# Patient Record
Sex: Male | Born: 1985 | Hispanic: Yes | Marital: Married | Smoking: Former smoker
Health system: Southern US, Community
[De-identification: ages and names within clinical notes are randomized; demographics above are authoritative.]

## PROBLEM LIST (undated history)

## (undated) ENCOUNTER — Ambulatory Visit: Admission: EM | Discharge status: Absent without leave | Payer: BLUE CROSS/BLUE SHIELD

## (undated) DIAGNOSIS — J329 Chronic sinusitis, unspecified: Secondary | ICD-10-CM

## (undated) DIAGNOSIS — R519 Headache, unspecified: Secondary | ICD-10-CM

## (undated) DIAGNOSIS — I10 Essential (primary) hypertension: Secondary | ICD-10-CM

## (undated) DIAGNOSIS — F32A Depression, unspecified: Secondary | ICD-10-CM

## (undated) DIAGNOSIS — F329 Major depressive disorder, single episode, unspecified: Secondary | ICD-10-CM

## (undated) DIAGNOSIS — T8859XA Other complications of anesthesia, initial encounter: Secondary | ICD-10-CM

## (undated) HISTORY — PX: RHINOPLASTY: SUR1284

## (undated) HISTORY — DX: Major depressive disorder, single episode, unspecified: F32.9

## (undated) HISTORY — PX: HERNIA REPAIR: SHX51

## (undated) HISTORY — DX: Chronic sinusitis, unspecified: J32.9

## (undated) HISTORY — DX: Depression, unspecified: F32.A

---

## 2011-03-15 HISTORY — PX: NOSE SURGERY: SHX723

## 2011-04-16 ENCOUNTER — Ambulatory Visit (INDEPENDENT_AMBULATORY_CARE_PROVIDER_SITE_OTHER): Payer: BC Managed Care – PPO | Admitting: Family Medicine

## 2011-04-16 VITALS — BP 116/78 | HR 66 | Temp 98.3°F | Resp 12 | Ht 68.75 in | Wt 161.0 lb

## 2011-04-16 DIAGNOSIS — J329 Chronic sinusitis, unspecified: Secondary | ICD-10-CM

## 2011-04-16 DIAGNOSIS — J019 Acute sinusitis, unspecified: Secondary | ICD-10-CM

## 2011-04-16 DIAGNOSIS — Z0000 Encounter for general adult medical examination without abnormal findings: Secondary | ICD-10-CM | POA: Insufficient documentation

## 2011-04-16 DIAGNOSIS — J069 Acute upper respiratory infection, unspecified: Secondary | ICD-10-CM

## 2011-04-16 MED ORDER — HYDROCODONE-HOMATROPINE 5-1.5 MG/5ML PO SYRP: 5.0000 mL | ORAL_SOLUTION | Freq: Four times a day (QID) | ORAL | Status: AC | PRN

## 2011-04-16 MED ORDER — AMOXICILLIN 875 MG PO TABS: 875.0000 mg | ORAL_TABLET | Freq: Two times a day (BID) | ORAL | Status: AC

## 2011-04-16 MED ORDER — CEFTRIAXONE SODIUM 1 G IJ SOLR
1.0000 g | INTRAMUSCULAR | Status: DC
  Administered 2011-04-16: 1 g via INTRAMUSCULAR

## 2011-04-16 NOTE — Patient Instructions (Signed)
Sinusitis (Sinusitis) Los senos paranasales son bolsas de aire que se encuentran dentro de los huesos de la cara. La aparicin de bacterias en los senos paranasales lleva a una infeccin. Esta se denomina sinusitis.Estas infecciones generalmente son el resultado de una obstruccin en los orificios que drenan los senos.   SNTOMAS Generalmente, segn que seno se infecte, habr diferentes reas en las que puede aparecer dolor.    Los senos maxilares generalmente producen dolor detrs de los ojos.     La sinusitis frontal ocasiona dolor en el medio de la frente y sobre los ojos.  Entre otros problemas (sntomas) se incluyen:  Dolor en la zona posterior de los dientes superiores.     Una secrecin similar al pus (purulenta) proveniente de la nariz.     Toda inflamacin, calor o sensibilidad en estas mismas reas son indicios de infeccin.  TRATAMIENTO La sinusitis se diagnostica a travs del examen fsico y radiografas. Si le han tomado radiografas, asegrese de retirar los resultados. O consulte el modo en que podr obtenerlos. Su mdico le prescribir medicamentos (antibiticos). Estos medicamentos se indican para combatir la infeccin. Tambin le prescribir un descongestivo para reducir la inflamacin del seno paranasal.   INSTRUCCIONES PARA EL CUIDADO DOMICILIARIO  Utilice los medicamentos de venta libre o de prescripcin para el dolor, el malestar o la fiebre, segn se lo indique el profesional que lo asiste.     Beba gran cantidad de lquidos. Los lquidos ayudan a que las mucosas de los senos nasales drenen ms fcilmente.     Aplique bolsas de calor hmedo o hielo en las zonas ms doloridas para aliviar las molestias.     Utilice Sprays nasales salinos para ayudar a humedecer los senos nasales. Estos pueden encontrarse en la farmacia local.  SOLICITE ATENCIN MDICA DE INMEDIATO SI:  Tiene fiebre.     Dolor en aumento, dolor de cabeza intenso o dolor de dientes.     Nuseas,  vmitos o sudoracin.     Hinchazn infrecuente alrededor del rostro o problemas en la visin.  EST SEGURO QUE:    Comprende las instrucciones para el alta mdica.     Controlar su enfermedad.     Solicitar atencin mdica de inmediato segn las indicaciones.  Document Released: 12/08/2004 Document Revised: 11/10/2010 ExitCare Patient Information 2012 ExitCare, LLC. 

## 2011-04-16 NOTE — Progress Notes (Signed)
26 year old Hispanic man who comes in with 4 days of progressive sinus pain nasal congestion and cough. He feels that he has another sinus infection and in the past has done very well with a shot of antibiotics. Is not had any fever and his cough is intermittently productive. The cough does keep him awake at night. 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  Review of systems is entirely negative with the exception of the upper respiratory symptoms of cough sinus congestion and sinus pain.  Objective: Ears normal normocephalic, no acute distress, chest clear, heart regular without murmur, and nasal passages markedly swollen with purulent yellow discharge:.  Assessment: Acute sinusitis  Plan Rocephin amoxicillin and Hydromet

## 2011-05-02 ENCOUNTER — Other Ambulatory Visit: Payer: Self-pay | Admitting: Family Medicine

## 2011-05-07 ENCOUNTER — Ambulatory Visit (INDEPENDENT_AMBULATORY_CARE_PROVIDER_SITE_OTHER): Payer: BC Managed Care – PPO | Admitting: Family Medicine

## 2011-05-07 VITALS — BP 130/77 | HR 48 | Temp 98.0°F | Resp 16 | Ht 68.75 in | Wt 157.8 lb

## 2011-05-07 DIAGNOSIS — F419 Anxiety disorder, unspecified: Secondary | ICD-10-CM

## 2011-05-07 DIAGNOSIS — J029 Acute pharyngitis, unspecified: Secondary | ICD-10-CM

## 2011-05-07 DIAGNOSIS — R05 Cough: Secondary | ICD-10-CM

## 2011-05-07 DIAGNOSIS — F411 Generalized anxiety disorder: Secondary | ICD-10-CM

## 2011-05-07 DIAGNOSIS — J329 Chronic sinusitis, unspecified: Secondary | ICD-10-CM

## 2011-05-07 DIAGNOSIS — R059 Cough, unspecified: Secondary | ICD-10-CM

## 2011-05-07 NOTE — Progress Notes (Signed)
Patient returns feeling much better.  Still has sore throat.  Has some cough and a bit of shortness of breath on occasion. Also c/o nervousness on occasion and requests something for nerves.  O:  HEENT - red throat, ears clear, neck normal, nose red and congested. Patient friendly but anxious  A:  Generalized anxiety, improved URI  P: refill meds Alprazolam .25 bid prn #60 3 rf proAIR with 1 rf

## 2011-11-09 ENCOUNTER — Ambulatory Visit (INDEPENDENT_AMBULATORY_CARE_PROVIDER_SITE_OTHER): Payer: BC Managed Care – PPO | Admitting: Family Medicine

## 2011-11-09 ENCOUNTER — Ambulatory Visit: Payer: BC Managed Care – PPO

## 2011-11-09 VITALS — BP 134/80 | HR 60 | Temp 98.5°F | Resp 18 | Ht 68.5 in | Wt 154.0 lb

## 2011-11-09 DIAGNOSIS — R071 Chest pain on breathing: Secondary | ICD-10-CM

## 2011-11-09 DIAGNOSIS — Z0000 Encounter for general adult medical examination without abnormal findings: Secondary | ICD-10-CM

## 2011-11-09 LAB — COMPREHENSIVE METABOLIC PANEL
ALT: 37 U/L (ref 0–53)
AST: 26 U/L (ref 0–37)
Albumin: 5 g/dL (ref 3.5–5.2)
Calcium: 9.8 mg/dL (ref 8.4–10.5)
Chloride: 107 mEq/L (ref 96–112)
Potassium: 3.8 mEq/L (ref 3.5–5.3)
Total Protein: 7.4 g/dL (ref 6.0–8.3)

## 2011-11-09 LAB — LDL CHOLESTEROL, DIRECT: Direct LDL: 100 mg/dL — ABNORMAL HIGH

## 2011-11-09 LAB — COMPREHENSIVE METABOLIC PANEL WITH GFR
Alkaline Phosphatase: 71 U/L (ref 39–117)
BUN: 13 mg/dL (ref 6–23)
CO2: 28 meq/L (ref 19–32)
Creat: 0.89 mg/dL (ref 0.50–1.35)
Glucose, Bld: 95 mg/dL (ref 70–99)
Sodium: 141 meq/L (ref 135–145)
Total Bilirubin: 0.6 mg/dL (ref 0.3–1.2)

## 2011-11-09 LAB — POCT CBC
Granulocyte percent: 52.3 %G (ref 37–80)
HCT, POC: 45.7 % (ref 43.5–53.7)
Hemoglobin: 14.4 g/dL (ref 14.1–18.1)
Lymph, poc: 2.2 (ref 0.6–3.4)
MCH, POC: 27.9 pg (ref 27–31.2)
MCHC: 31.5 g/dL — AB (ref 31.8–35.4)
MCV: 88.6 fL (ref 80–97)
MID (cbc): 0.3 (ref 0–0.9)
MPV: 7.5 fL (ref 0–99.8)
POC Granulocyte: 2.8 (ref 2–6.9)
POC LYMPH PERCENT: 41.9 % (ref 10–50)
POC MID %: 5.8 %M (ref 0–12)
Platelet Count, POC: 300 10*3/uL (ref 142–424)
RBC: 5.16 M/uL (ref 4.69–6.13)
RDW, POC: 13.4 %
WBC: 5.3 10*3/uL (ref 4.6–10.2)

## 2011-11-09 NOTE — Progress Notes (Signed)
[facility]:  Office Visit  Chief Complaint:  Chief Complaint  Patient presents with  . Annual Exam    HPI: Theodore Bishop is a 26 y.o. male who complains of  CPE. He also has some CP without SOB.  He works as a Psychologist, occupational. Has some pleuritic CP with deep breaths. Denies fevers, chills, weightloss, cough, night sweats, hemoptysis.  History reviewed. PMH: + sinusitis Past Surgical History  Procedure Date  . Hernia repair    History   Social History  . Marital Status: Single    Spouse Name: N/A    Number of Children: N/A  . Years of Education: N/A   Social History Main Topics  . Smoking status: Former Smoker -- 0.0 packs/day for 9 years    Types: Cigarettes  . Smokeless tobacco: Never Used   Comment: smokes 1 cigarette per week  . Alcohol Use: No     quit x 1 month - previously drank 12pk every weekend  . Drug Use: No  . Sexually Active: Yes   Other Topics Concern  . None   Social History Narrative  . None   Family History  Problem Relation Age of Onset  . Cancer Mother    No Known Allergies Prior to Admission medications   Not on File     ROS: The patient denies fevers, chills, night sweats, unintentional weight loss,  palpitations, wheezing, dyspnea on exertion, nausea, vomiting, abdominal pain, dysuria, hematuria, melena, numbness, weakness, or tingling.   All other systems have been reviewed and were otherwise negative with the exception of those mentioned in the HPI and as above.    PHYSICAL EXAM: Filed Vitals:   11/09/11 1613  BP: 134/80  Pulse: 60  Temp: 98.5 F (36.9 C)  Resp: 18   Filed Vitals:   11/09/11 1613  Height: 5' 8.5" (1.74 m)  Weight: 154 lb (69.854 kg)   Body mass index is 23.08 kg/(m^2).  General: Alert, no acute distress HEENT:  Normocephalic, atraumatic, oropharynx patent. EOMI, PERRLA, fundoscopic exam grossly nl. Boggy red nasal mucosa, nose is crooked. Sinuses nontender Cardiovascular:  Regular rate  and rhythm, no rubs murmurs or gallops.  No Carotid bruits, radial pulse intact. No pedal edema.  Respiratory: Clear to auscultation bilaterally.  No wheezes, rales, or rhonchi.  No cyanosis, no use of accessory musculature GI: No organomegaly, abdomen is soft and non-tender, positive bowel sounds.  No masses. Skin: No rashes. Neurologic: Facial musculature symmetric. CN 2-12 grossly nl Psychiatric: Patient is appropriate throughout our interaction. Lymphatic: No cervical lymphadenopathy Musculoskeletal: Gait intact. GU: hernia repair intact. No lesions, masses, rashes, dc from testicles, scrotum   LABS: Results for orders placed in visit on 11/09/11  POCT CBC      Component Value Range   WBC 5.3  4.6 - 10.2 K/uL   Lymph, poc 2.2  0.6 - 3.4   POC LYMPH PERCENT 41.9  10 - 50 %L   MID (cbc) 0.3  0 - 0.9   POC MID % 5.8  0 - 12 %M   POC Granulocyte 2.8  2 - 6.9   Granulocyte percent 52.3  37 - 80 %G   RBC 5.16  4.69 - 6.13 M/uL   Hemoglobin 14.4  14.1 - 18.1 g/dL   HCT, POC 16.1  09.6 - 53.7 %   MCV 88.6  80 - 97 fL   MCH, POC 27.9  27 - 31.2 pg   MCHC 31.5 (*) 31.8 - 35.4  g/dL   RDW, POC 16.1     Platelet Count, POC 300  142 - 424 K/uL   MPV 7.5  0 - 99.8 fL     EKG/XRAY:   Primary read interpreted by Dr. Conley Rolls at [facility]. No acute cardiopulmonary process   ASSESSMENT/PLAN: Encounter Diagnoses  Name Primary?  . Annual physical exam Yes  . Chest pain on breathing    Patient doing well overall Labs pending At the last minute , patient asks for ENT referral. Patient desires ENT referral for recurrent sinusitis which he has been on multiple oral and nasal meds without relief. He has a crooked nose and is worried that a deviated septum might be the problem.      Hamilton Capri PHUONG, DO 11/09/2011 4:49 PM

## 2011-11-11 ENCOUNTER — Encounter: Payer: Self-pay | Admitting: Family Medicine

## 2011-11-16 ENCOUNTER — Encounter: Payer: Self-pay | Admitting: Family Medicine

## 2011-11-18 ENCOUNTER — Ambulatory Visit (INDEPENDENT_AMBULATORY_CARE_PROVIDER_SITE_OTHER): Payer: BC Managed Care – PPO | Admitting: Physician Assistant

## 2011-11-18 VITALS — BP 117/68 | HR 51 | Temp 98.6°F | Resp 16 | Ht 69.0 in | Wt 156.0 lb

## 2011-11-18 DIAGNOSIS — J029 Acute pharyngitis, unspecified: Secondary | ICD-10-CM

## 2011-11-18 DIAGNOSIS — J069 Acute upper respiratory infection, unspecified: Secondary | ICD-10-CM

## 2011-11-18 DIAGNOSIS — R059 Cough, unspecified: Secondary | ICD-10-CM

## 2011-11-18 DIAGNOSIS — R05 Cough: Secondary | ICD-10-CM

## 2011-11-18 MED ORDER — GUAIFENESIN ER 1200 MG PO TB12: 1.0000 | ORAL_TABLET | Freq: Two times a day (BID) | ORAL | Status: DC

## 2011-11-18 MED ORDER — HYDROCOD POLST-CHLORPHEN POLST 10-8 MG/5ML PO LQCR: 5.0000 mL | Freq: Every evening | ORAL | Status: DC | PRN

## 2011-11-18 MED ORDER — IPRATROPIUM BROMIDE 0.06 % NA SOLN: 2.0000 | Freq: Three times a day (TID) | NASAL | Status: DC

## 2011-11-18 MED ORDER — MAGIC MOUTHWASH W/LIDOCAINE: ORAL | Status: DC

## 2011-11-18 NOTE — Progress Notes (Signed)
  Subjective:    Patient ID: Theodore Bishop, male    DOB: 1985-07-09, 26 y.o.   MRN: [id number]  HPI  Pt presents to clinic with cold symptoms for the last several days with congestion and cough with ST that seems to be worse in the am.  He has had no exposures to strep that he knows of.  He has used no OTC meds.  Review of Systems  Constitutional: Negative for fever and chills.  HENT: Positive for congestion, sore throat, rhinorrhea and postnasal drip.   Respiratory: Positive for cough.   Neurological: Negative for headaches.       Objective:   Physical Exam  Vitals reviewed. Constitutional: He is oriented to person, place, and time. He appears well-developed and well-nourished.  HENT:  Head: Normocephalic and atraumatic.  Right Ear: Hearing, tympanic membrane, external ear and ear canal normal.  Left Ear: Hearing, tympanic membrane, external ear and ear canal normal.  Nose: Nose normal.  Mouth/Throat: Uvula is midline. Posterior oropharyngeal erythema (mild) present. No oropharyngeal exudate or posterior oropharyngeal edema.  Eyes: Conjunctivae are normal.  Cardiovascular: Normal rate, regular rhythm and normal heart sounds.   Pulmonary/Chest: Effort normal and breath sounds normal.  Neurological: He is alert and oriented to person, place, and time.  Skin: Skin is warm and dry.  Psychiatric: He has a normal mood and affect. His behavior is normal. Judgment and thought content normal.          Assessment & Plan:   1. Sore throat    2. URI (upper respiratory infection)  Guaifenesin (MUCINEX MAXIMUM STRENGTH) 1200 MG TB12, ipratropium (ATROVENT) 0.06 % nasal spray  3. Cough  chlorpheniramine-HYDROcodone (TUSSIONEX PENNKINETIC ER) 10-8 MG/5ML LQCR   Push fluids.  Tylenol/motrin prn.

## 2013-04-23 ENCOUNTER — Ambulatory Visit: Payer: BC Managed Care – PPO

## 2013-04-23 ENCOUNTER — Ambulatory Visit (INDEPENDENT_AMBULATORY_CARE_PROVIDER_SITE_OTHER): Payer: BC Managed Care – PPO | Admitting: Family Medicine

## 2013-04-23 VITALS — BP 108/66 | HR 72 | Temp 97.8°F | Resp 16 | Ht 68.0 in | Wt 160.6 lb

## 2013-04-23 DIAGNOSIS — R059 Cough, unspecified: Secondary | ICD-10-CM

## 2013-04-23 DIAGNOSIS — R05 Cough: Secondary | ICD-10-CM

## 2013-04-23 DIAGNOSIS — H11009 Unspecified pterygium of unspecified eye: Secondary | ICD-10-CM

## 2013-04-23 DIAGNOSIS — H11002 Unspecified pterygium of left eye: Secondary | ICD-10-CM

## 2013-04-23 DIAGNOSIS — R042 Hemoptysis: Secondary | ICD-10-CM

## 2013-04-23 LAB — POCT CBC
GRANULOCYTE PERCENT: 51.7 % (ref 37–80)
HEMATOCRIT: 42.2 % — AB (ref 43.5–53.7)
HEMOGLOBIN: 13.5 g/dL — AB (ref 14.1–18.1)
Lymph, poc: 2.1 (ref 0.6–3.4)
MCH, POC: 28.6 pg (ref 27–31.2)
MCHC: 32 g/dL (ref 31.8–35.4)
MCV: 89.5 fL (ref 80–97)
MID (cbc): 0.3 (ref 0–0.9)
MPV: 7.4 fL (ref 0–99.8)
POC Granulocyte: 2.6 (ref 2–6.9)
POC LYMPH PERCENT: 42.2 %L (ref 10–50)
POC MID %: 6.1 %M (ref 0–12)
Platelet Count, POC: 243 10*3/uL (ref 142–424)
RBC: 4.72 M/uL (ref 4.69–6.13)
RDW, POC: 12 %
WBC: 5 10*3/uL (ref 4.6–10.2)

## 2013-04-23 MED ORDER — AZITHROMYCIN 250 MG PO TABS: ORAL_TABLET | ORAL | Status: DC

## 2013-04-23 MED ORDER — HYDROCODONE-HOMATROPINE 5-1.5 MG/5ML PO SYRP: 5.0000 mL | ORAL_SOLUTION | ORAL | Status: DC | PRN

## 2013-04-23 NOTE — Progress Notes (Signed)
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 He got sick a week ago on Wednesday. After a few days he was feeling bad and [place] He was given an injection, he does not know if it was cortisone or antibiotics. He got better for about 2 days, then got feeling worse again. He is coughing a moderate amount. This morning he was coughing up blood which concerned him. He does not have any headache or ear pain. Last week he fell he had the flu. He is concerned about his left eye which has a growth on it. He has a minimal sore throat. At it's not a terrible sore throat, he just feels funny. He has not had chest pain. His phlegm is clear.   Objective: Pleasant gentleman in no acute distress. TMs are normal. Left has a moderately large pterygium on it. It extends 2 mm over the iris. His throat is clear. Neck supple without nodes or thyromegaly. Chest is clear to percussion and auscultation. Heart regular without murmurs. Abdomen soft, nontender  Assessment: Hemoptysis Cough Pterygium left eye  Plan: We'll refer him to an ophthalmologist to get the eye evaluated  Check a CBC and chest x-ray on him and then decide treatment.  Results for orders placed in visit on 04/23/13  POCT CBC      Result Value Range   WBC 5.0  4.6 - 10.2 K/uL   Lymph, poc 2.1  0.6 - 3.4   POC LYMPH PERCENT 42.2  10 - 50 %L   MID (cbc) 0.3  0 - 0.9   POC MID % 6.1  0 - 12 %M   POC Granulocyte 2.6  2 - 6.9   Granulocyte percent 51.7  37 - 80 %G   RBC 4.72  4.69 - 6.13 M/uL   Hemoglobin 13.5 (*) 14.1 - 18.1 g/dL   HCT, POC 09.842.2 (*) 11.943.5 - 53.7 %   MCV 89.5  80 - 97 fL   MCH, POC 28.6  27 - 31.2 pg   MCHC 32.0  31.8 - 35.4 g/dL   RDW, POC 14.712.0     Platelet Count, POC 243  142 - 424 K/uL   MPV 7.4  0 - 99.8 fL   [facility] reading (PRIMARY) by  Dr. Alwyn RenHopper Normal cxr  Bronchitis.  Cough syrup Azithromycin Continue the nose spray that you have Return if problems

## 2013-04-23 NOTE — Patient Instructions (Signed)
Drink plenty of fluids  Take antibiotics as directed  Return if not improving or if continued to cough up blood

## 2013-05-13 ENCOUNTER — Telehealth: Payer: Self-pay

## 2013-08-31 ENCOUNTER — Ambulatory Visit (INDEPENDENT_AMBULATORY_CARE_PROVIDER_SITE_OTHER): Payer: BC Managed Care – PPO | Admitting: Emergency Medicine

## 2013-08-31 VITALS — BP 112/60 | HR 80 | Temp 98.1°F | Resp 16 | Ht 69.5 in | Wt 161.0 lb

## 2013-08-31 DIAGNOSIS — H5710 Ocular pain, unspecified eye: Secondary | ICD-10-CM

## 2013-08-31 DIAGNOSIS — H5712 Ocular pain, left eye: Secondary | ICD-10-CM

## 2013-08-31 DIAGNOSIS — R51 Headache: Secondary | ICD-10-CM

## 2013-08-31 MED ORDER — OFLOXACIN 0.3 % OP SOLN: 1.0000 [drp] | Freq: Four times a day (QID) | OPHTHALMIC | Status: DC

## 2013-08-31 MED ORDER — MELOXICAM 7.5 MG PO TABS: ORAL_TABLET | ORAL | Status: DC

## 2013-08-31 NOTE — Patient Instructions (Signed)
Abrasin corneal (Corneal Abrasion) La crnea es la cubierta transparente en la parte anterior y central del ojo. Cuando mira la parte colorida del ojo (iris), est mirando a travs de la crnea. La crnea es un tejido muy delgado formado por varias capas. La capa superficial es una capa nica de clulas (epitelio corneal) y es uno de los tejidos ms sensibles del organismo. Si un rasguo o una lesin hacen que el epitelio corneal se desprenda, a esto se lo llama abrasin corneal. Si la lesin se extiende a los tejidos que se encuentran por debajo del epitelio, la afeccin se denomina lcera corneal. CAUSAS   Rasguos.  Traumatismos.  Cuerpo extrao en el ojo. Algunas personas tienen recurrencias de abrasiones en la zona de la lesin original incluso despus de que esta ha sanado (sndrome de erosin recurrente). El sndrome de erosin recurrente generalmente mejora y desaparece con el tiempo. SNTOMAS   Dolor en el ojo.  Dificultad o imposibilidad de Financial controller ojo lesionado.  El ojo est muy sensible a la luz.  Las erosiones recurrentes tienden a ocurrir de Wellsite geologist repentina, a primera hora de la maana, generalmente al despertar y abrir los ojos. DIAGNSTICO  Su mdico podr diagnosticar una abrasin corneal durante un examen ocular. Generalmente se coloca un tinte en el ojo, usando un gotero o una pequea tira de papel humedecida con sus lgrimas. Al examinar el ojo con una luz especial, aparece claramente la abrasin destacada por el tinte. TRATAMIENTO   Las abrasiones pequeas pueden tratarse con gotas o un ungento con antibitico.  Es posible que le apliquen un parche de presin sobre el ojo. Si este es [address], siga las instrucciones de su mdico respecto de cundo Corporate treasurer. No conduzca ni opere maquinaria mientras lleve puesto el parche. Es difcil juzgar las Sales promotion account executive. Si la abrasin se infecta y se disemina hacia tejidos ms profundos de  la crnea, puede producirse una lcera corneal. Esto es ms grave porque puede causar una cicatriz en la crnea. Las cicatrices en la crnea interfieren con el paso de la luz a travs de esta y causan prdida de la visin en el ojo involucrado. INSTRUCCIONES PARA EL CUIDADO EN EL HOGAR  Utilice los medicamentos o el ungento segn lo indicado. Utilice los medicamentos de venta libre o recetados para Primary school teacher, el malestar o la fiebre, segn se lo indique el mdico.  No conduzca ni opere maquinarias mientras tenga el parche en el ojo. En estas condiciones no puede juzgar correctamente las distancias.  Si el mdico le ha dado fecha para una visita de control, es importante que concurra. No cumplir con las visitas de control puede dar como resultado una infeccin grave en el ojo o una prdida permanente de la visin. Si hay algn problema que le impide acudir a la cita, avsele a su mdico. SOLICITE ATENCIN MDICA SI:   Electronics engineer, tiene sensibilidad a la luz y experimenta una sensacin de picazn en un ojo o en ambos.  El parche de presin se afloja continuamente y puede parpadear debajo del parche despus del tratamiento.  Aparece algn tipo de secrecin en el ojo despus del tratamiento o si despierta con los prpados pegados por la maana.  Por la [place], siente los mismos sntomas que sinti en los [address] en que tuvo la abrasin original, algunas semanas o meses despus de la curacin. ASEGRESE DE QUE:   Comprende estas instrucciones.  Controlar su afeccin.  Recibir [place] de  inmediato si no mejora o si empeora. Document Released: 02/28/2005 Document Revised: 03/05/2013 [facility] Patient Information 2015 [place], [place]. This information is not intended to replace advice given to you by your health care provider. Make sure you discuss any questions you have with your health care provider. Cefalea migraosa (Migraine Headache) Una cefalea migraosa es un dolor intenso y punzante  en uno o ambos lados de la cabeza. La migraa puede durar desde 30 minutos hasta varias horas. CAUSAS  No siempre se conoce la causa exacta de la cefalea migraosa. Sin embargo, IT consultantpueden aparecer Circuit Citycuando los nervios del cerebro se irritan y liberan ciertas sustancias qumicas que causan inflamacin. Esto ocasiona dolor. Existen tambin ciertos factores que pueden desencadenar las migraas, como los siguientes:  Alcohol.  Fumar.  Estrs.  La menstruacin  Quesos aejados.  Los alimentos o las bebidas que contienen nitratos, glutamato, aspartamo o tiramina.  Falta de sueo.  Chocolate.  Cafena.  Hambre.  Actividad fsica extenuante.  Fatiga.  Medicamentos que se usan para tratar Aeronautical engineerel dolor en el pecho (nitroglicerina), pldoras anticonceptivas, estrgeno y algunos medicamentos para la hipertensin arterial. SIGNOS Y SNTOMAS  Dolor en uno o ambos lados de la cabeza.  Dolor pulsante o punzante.  Dolor intenso que impide Ameren Corporationrealizar las actividades diarias.  Dolor que se agrava por cualquier actividad fsica.  Nuseas, vmitos o ambos.  Mareos.  Dolor con la exposicin a las luces brillantes, a los ruidos fuertes o la [place]actividad.  Sensibilidad general a las luces brillantes, a los ruidos fuertes o a los Limited Brandsolores. Antes de sufrir una migraa, puede recibir seales de advertencia (aura). Un aura puede incluir:  Ver las luces intermitentes.  Ver puntos brillantes, halos o lneas en zigzag.  Tener una visin en tnel o visin borrosa.  Sensacin de entumecimiento u hormigueo.  Dificultad para hablar  Debilidad muscular. DIAGNSTICO  La cefalea migraosa se diagnostica en funcin de lo siguiente:  Sntomas.  Examen fsico.  Neomia DearUna TC (tomografa computada) o resonancia magntica de la cabeza. Estas pruebas de diagnstico por imagen no pueden diagnosticar las migraas, pero pueden ayudar a Sales promotion account executivedescartar otras causas de las cefaleas. TRATAMIENTO Le prescribirn medicamentos  para Engineer, materialsaliviar el dolor y las nuseas. Tambin podrn administrarse medicamentos para ayudar a Armed forces training and education officerprevenir las migraas recurrentes.  INSTRUCCIONES PARA EL CUIDADO EN EL HOGAR  Slo tome medicamentos de venta libre o recetados para Primary school teachercalmar el dolor o Environmental health practitionerel malestar, segn las indicaciones de su mdico. No se recomienda usar los opiceos a Air cabin crewlargo plazo.  Cuando tenga la migraa, acustese en un cuarto oscuro y tranquilo  Lleve un registro diario para Financial risk analystaveriguar lo que puede provocar las Soil scientistcefaleas migraosas. Por ejemplo, escriba:  Lo que usted come y bebe.  Cunto tiempo duerme.  Algn cambio en su dieta o en los medicamentos.  Limite el consumo de bebidas alcohlicas.  Si fuma, deje de hacerlo.  Duerma entre 7 y 9horas, o segn las recomendaciones del mdico.  Limite el estrs.  Mantenga las luces tenues si le Goodrich Corporationmolestan las luces brillantes y la [place]migraa empeora. SOLICITE ATENCIN MDICA DE INMEDIATO SI:   La migraa se hace cada vez ms intensa.  Tiene fiebre.  Presenta rigidez en el cuello.  Tiene prdida de visin.  Presenta debilidad muscular o prdida del control muscular.  Comienza a perder el equilibrio o tiene problemas para caminar.  Sufre mareos o se desmaya.  Tiene sntomas graves que son diferentes a los primeros sntomas. ASEGRESE DE QUE:   Comprende estas instrucciones.  Controlar su afeccin.  Recibir ayuda de inmediato si no mejora o si empeora. Document Released: 02/28/2005 Document Revised: 12/19/2012 [facility] Patient Information 2015 [place], [place]. This information is not intended to replace advice given to you by your health care provider. Make sure you discuss any questions you have with your health care provider. Corneal Abrasion The cornea is the clear covering at the front and center of the eye. When looking at the colored portion of the eye (iris), you are looking through the cornea. This very thin tissue is made up of many layers. The surface layer  is a single layer of cells (corneal epithelium) and is one of the most sensitive tissues in the body. If a scratch or injury causes the corneal epithelium to come off, it is called a corneal abrasion. If the injury extends to the tissues below the epithelium, the condition is called a corneal ulcer. CAUSES   Scratches.  Trauma.  Foreign body in the eye. Some people have recurrences of abrasions in the area of the original injury even after it has healed (recurrent erosion syndrome). Recurrent erosion syndrome generally improves and goes away with time. SYMPTOMS   Eye pain.  Difficulty or inability to keep the injured eye open.  The eye becomes very sensitive to light.  Recurrent erosions tend to happen suddenly, first thing in the morning, usually after waking up and opening the eye. DIAGNOSIS  Your health care provider can diagnose a corneal abrasion during an eye exam. Dye is usually placed in the eye using a drop or a small paper strip moistened by your tears. When the eye is examined with a special light, the abrasion shows up clearly because of the dye. TREATMENT   Small abrasions may be treated with antibiotic drops or ointment alone.  A pressure patch may be put over the eye. If this is done, follow your doctor's instructions for when to remove the patch. Do not drive or use machines while the eye patch is on. Judging distances is hard to do with a patch on. If the abrasion becomes infected and spreads to the deeper tissues of the cornea, a corneal ulcer can result. This is serious because it can cause corneal scarring. Corneal scars interfere with light passing through the cornea and cause a loss of vision in the involved eye. HOME CARE INSTRUCTIONS  Use medicine or ointment as directed. Only take over-the-counter or prescription medicines for pain, discomfort, or fever as directed by your health care provider.  Do not drive or operate machinery if your eye is patched. Your  ability to judge distances is impaired.  If your health care provider has given you a follow-up appointment, it is very important to keep that appointment. Not keeping the appointment could result in a severe eye infection or permanent loss of vision. If there is any problem keeping the appointment, let your health care provider know. SEEK MEDICAL CARE IF:   You have pain, light sensitivity, and a scratchy feeling in one eye or both eyes.  Your pressure patch keeps loosening up, and you can blink your eye under the patch after treatment.  Any kind of discharge develops from the eye after treatment or if the lids stick together in the morning.  You have the same symptoms in the morning as you did with the original abrasion days, weeks, or months after the abrasion healed. MAKE SURE YOU:   Understand these instructions.  Will watch your condition.  Will get help right away if you are not  doing well or get worse. Document Released: 02/26/2000 Document Revised: 03/05/2013 Document Reviewed: 11/05/2012 [facility] GroupExitCare Patient Information 2015 [place]ExitCare, [place]LLC. This information is not intended to replace advice given to you by your health care provider. Make sure you discuss any questions you have with your health care provider.

## 2013-08-31 NOTE — Progress Notes (Signed)
Subjective:    Patient ID: Norman HerrlichHomero Oquendo, male    DOB: 05/06/1985, 28 y.o.   MRN: [id number]   This chart was scribed for Lesle ChrisSteven Inayah Woodin, MD by Chestine SporeSoijett Blue, ED Scribe. The patient was seen in room 5 at 1:17 PM.   Chief Complaint  Patient presents with  . Conjunctivitis    to the left eye x's 4 days  . Headache    x's 3 days    HPI  Therron Ardyth HarpsHernandez is a 28 y.o. male who presents today complaining of left eye pain onset 4 days ago. He states that his eye has been red for 4 days and the HA came after. 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 He states that he sometimes wears his protection, but he not all the time. He states that he has had a wielded injury pain to his eye before and it lasted for only 1 day. He states that the current pain seems similar. He states that his sisters kid had pink eye last week. He states that he has associated symptoms of HA and a runny nose only on the left side, with congestion to the left nose. He denies any other associated symptoms. 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 He states that he does not currently wear contacts. He states that he has never had headaches like this before. He states that he took Nyquil with no relief for his symptoms.   He states that he has been wearing sunglasses. He states that the sunglasses help a littler with the pain. He states that the eye drops given during the visit helped alleviate some of his pain. He denies vomiting, numbness, weakness, and appetite change.   Past Medical History  Diagnosis Date  . Sinusitis     Current Outpatient Prescriptions on File Prior to Visit  Medication Sig Dispense Refill  . Alum & Mag Hydroxide-Simeth (MAGIC MOUTHWASH W/LIDOCAINE) SOLN 1 tsp po q1-2h prn sore throat  120 mL  0  . azithromycin (ZITHROMAX) 250 MG tablet Take 2 pills today, then one daily for 4 days for infection  6 tablet  0  . chlorpheniramine-HYDROcodone (TUSSIONEX PENNKINETIC ER) 10-8 MG/5ML LQCR  Take 5 mLs by mouth at bedtime as needed.  50 mL  0  . Guaifenesin (MUCINEX MAXIMUM STRENGTH) 1200 MG TB12 Take 1 tablet (1,200 mg total) by mouth 2 (two) times daily.  14 each  0  . HYDROcodone-homatropine (HYCODAN) 5-1.5 MG/5ML syrup Take 5 mLs by mouth every 4 (four) hours as needed for cough.  120 mL  0  . ipratropium (ATROVENT) 0.06 % nasal spray Place 2 sprays into the nose 3 (three) times daily.  15 mL  0   No current facility-administered medications on file prior to visit.   No Known Allergies   Review of Systems  Constitutional: Negative for appetite change.  HENT: Positive for congestion and rhinorrhea.   Eyes: Positive for pain and redness. Negative for photophobia.  Gastrointestinal: Negative for vomiting and diarrhea.  Neurological: Positive for headaches. Negative for weakness.      BP 112/60  Pulse 80  Temp(Src) 98.1 F (36.7 C) (Oral)  Resp 16  Ht 5' 9.5" (1.765 m)  Wt 161 lb (73.029 kg)  BMI 23.44 kg/m2  SpO2 98%   Objective:   Physical Exam  Nursing note and vitals reviewed. Constitutional: He is oriented to person, place, and time. He appears well-developed and well-nourished. No distress.  HENT:  Head: Normocephalic and atraumatic.  Eyes: EOM are normal. Pupils are equal, round, and reactive to light. Left conjunctiva is injected.  Slit lamp exam:      The left eye shows fluorescein uptake (fine uptake on the lower portion of the cornea).  Dis-margins are sharp. Two drops of paperacane were dropped in his eye. Lid everted and swapped. fluoresence had no uptake.    Neck: Neck supple. No tracheal deviation present.  Cardiovascular: Normal rate.   Pulmonary/Chest: Effort normal. No respiratory distress.  Musculoskeletal: Normal range of motion.  Neurological: He is alert and oriented to person, place, and time.  Skin: Skin is warm and dry.  Psychiatric: He has a normal mood and affect. His behavior is normal.         Assessment & Plan:  DIAGNOSTIC  STUDIES: Oxygen Saturation is 98% on room air, normal by my interpretation.    COORDINATION OF CARE: 1:27 PM-Discussed treatment plan which includeswith pt at bedside and pt agreed to plan.  1:32 PM- Recheck with Pt.   I do suspect the patient has a low-grade corneal abrasion secondary to welding exposure. We'll treat with Ocuflox and Mobic for pain recheck if not improved in 48 hours. I personally performed the services described in this documentation, which was scribed in my presence. The recorded information has been reviewed and is accurate.

## 2014-05-10 ENCOUNTER — Ambulatory Visit (INDEPENDENT_AMBULATORY_CARE_PROVIDER_SITE_OTHER): Payer: BLUE CROSS/BLUE SHIELD | Admitting: Family Medicine

## 2014-05-10 ENCOUNTER — Ambulatory Visit (INDEPENDENT_AMBULATORY_CARE_PROVIDER_SITE_OTHER): Payer: BLUE CROSS/BLUE SHIELD

## 2014-05-10 VITALS — BP 98/64 | HR 73 | Temp 97.9°F | Resp 16 | Ht 68.5 in | Wt 157.2 lb

## 2014-05-10 DIAGNOSIS — M25511 Pain in right shoulder: Secondary | ICD-10-CM

## 2014-05-10 DIAGNOSIS — G8929 Other chronic pain: Secondary | ICD-10-CM

## 2014-05-10 DIAGNOSIS — M25512 Pain in left shoulder: Secondary | ICD-10-CM

## 2014-05-10 MED ORDER — DICLOFENAC SODIUM 75 MG PO TBEC: 75.0000 mg | DELAYED_RELEASE_TABLET | Freq: Two times a day (BID) | ORAL | Status: DC

## 2014-05-10 NOTE — Progress Notes (Signed)
Subjective: Patient is here with an 8 month history of shoulder pain in his left shoulder. Actually his right shoulder is hurting also but the left was worse. 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 He's not been boxing for the last few months because of the pain. When he flexes shoulder forward his back of the shoulder hurt and on top of the shoulder hurts. He has hard time raising his hands up above his head because that hurts. No real specific injury.  Objective: Good range of motion. Tender along the posterior aspect of the shoulder girdle on the left. Right does not seem to be tender. Good range of motion. Motor strength symmetrical and good.  Assessment: Bilateral shoulder strain, left worse than right  Plan: Patient wanted an injection of cortisone. I decided to try him on some diclofenac first. Advised him of the risks of injection. I think it is wiser to try some diclofenac first, but if it does not help he can return and we will put a shot of cortisone in there. There is always at risk of infection.  [facility] reading (PRIMARY) by  Dr. Alwyn RenHopper Normal shoulder.

## 2014-05-10 NOTE — Patient Instructions (Signed)
Take diclofenac one twice daily  If not improving in the next 2 weeks please return for a cortisone injection

## 2014-06-03 ENCOUNTER — Ambulatory Visit (INDEPENDENT_AMBULATORY_CARE_PROVIDER_SITE_OTHER): Payer: BLUE CROSS/BLUE SHIELD | Admitting: Physician Assistant

## 2014-06-03 VITALS — BP 124/74 | HR 68 | Temp 98.0°F | Resp 17 | Ht 66.0 in | Wt 156.0 lb

## 2014-06-03 DIAGNOSIS — J069 Acute upper respiratory infection, unspecified: Secondary | ICD-10-CM | POA: Diagnosis not present

## 2014-06-03 DIAGNOSIS — M542 Cervicalgia: Secondary | ICD-10-CM | POA: Diagnosis not present

## 2014-06-03 DIAGNOSIS — B9789 Other viral agents as the cause of diseases classified elsewhere: Principal | ICD-10-CM

## 2014-06-03 MED ORDER — MELOXICAM 15 MG PO TABS: 15.0000 mg | ORAL_TABLET | Freq: Every day | ORAL | Status: DC

## 2014-06-03 MED ORDER — HYDROCOD POLST-CHLORPHEN POLST 10-8 MG/5ML PO LQCR: 5.0000 mL | Freq: Two times a day (BID) | ORAL | Status: DC | PRN

## 2014-06-03 MED ORDER — IPRATROPIUM BROMIDE 0.03 % NA SOLN: 2.0000 | Freq: Two times a day (BID) | NASAL | Status: DC

## 2014-06-03 NOTE — Patient Instructions (Signed)
Take mobic once a day for neck pain - do not take with ibuprofen, advil, motrin or aleve.  You may continue to take tylenol. Use nasal spray twice a day. Use cough syrup at night to help you sleep. Call me in 3-4 days if your symptoms are not getting any better and I will prescribe you something stronger

## 2014-06-03 NOTE — Progress Notes (Signed)
Subjective:    Patient ID: Theodore HerrlichHomero Bishop, male    DOB: 06/28/1985, 29 y.o.   MRN: [id number]  HPI  This is a 29 year old male presenting with cough, nasal congestion, sinus pressure and sore throat x 6 days. Cough is minimally productive. He is having neck pain described as soreness. He denies any change in activity other than coughing. Denies fever, chills, otalgia, SOB or wheezing.  He is taking nyquil, dayquil and tylenol and helping some. Does not smoke. He does not have a history of asthma. He does not have a history of allergies. No sick contacts.   Review of Systems  Constitutional: Negative for fever and chills.  HENT: Positive for congestion, sinus pressure and sore throat. Negative for ear pain.   Eyes: Negative for redness.  Respiratory: Positive for cough. Negative for shortness of breath and wheezing.   Gastrointestinal: Negative for nausea, vomiting and abdominal pain.  Skin: Negative for rash.  Allergic/Immunologic: Negative for environmental allergies.  Hematological: Negative for adenopathy.  Psychiatric/Behavioral: Positive for sleep disturbance.    There are no active problems to display for this patient.  Prior to Admission medications   Medication Sig Start Date End Date Taking? Authorizing Provider  ibuprofen (ADVIL,MOTRIN) 200 MG tablet Take 200 mg by mouth every 6 (six) hours as needed.   Yes Historical Provider, MD   No Known Allergies  Patient's social and family history were reviewed.     Objective:   Physical Exam  Constitutional: He is oriented to person, place, and time. He appears well-developed and well-nourished. No distress.  HENT:  Head: Normocephalic and atraumatic.  Right Ear: Hearing, tympanic membrane, external ear and ear canal normal.  Left Ear: Hearing, tympanic membrane, external ear and ear canal normal.  Nose: Mucosal edema present. Right sinus exhibits no maxillary sinus tenderness and no frontal sinus tenderness. Left sinus  exhibits no maxillary sinus tenderness and no frontal sinus tenderness.  Mouth/Throat: Uvula is midline and mucous membranes are normal. Posterior oropharyngeal erythema present. No oropharyngeal exudate or posterior oropharyngeal edema.  Eyes: Conjunctivae and lids are normal. Right eye exhibits no discharge. Left eye exhibits no discharge. No scleral icterus.  Neck: Normal range of motion. Muscular tenderness (right paraspinal) present. No spinous process tenderness present. No Brudzinski's sign noted.  Cardiovascular: Normal rate, regular rhythm, normal heart sounds and normal pulses.   No murmur heard. Pulmonary/Chest: Effort normal and breath sounds normal. No respiratory distress. He has no wheezes. He has no rhonchi. He has no rales.  Musculoskeletal: Normal range of motion.  Lymphadenopathy:       Head (right side): No submental, no submandibular and no tonsillar adenopathy present.       Head (left side): No submental, no submandibular and no tonsillar adenopathy present.    He has no cervical adenopathy.  Neurological: He is alert and oriented to person, place, and time.  Skin: Skin is warm, dry and intact. No lesion and no rash noted.  Psychiatric: He has a normal mood and affect. His speech is normal and behavior is normal. Thought content normal.   BP 124/74 mmHg  Pulse 68  Temp(Src) 98 F (36.7 C) (Oral)  Resp 17  Ht 5\' 6"  (1.676 m)  Wt 156 lb (70.761 kg)  BMI 25.19 kg/m2  SpO2 97%     Assessment & Plan:  1. Neck pain Meloxicam for neck pain. - meloxicam (MOBIC) 15 MG tablet; Take 1 tablet (15 mg total) by mouth daily.  Dispense:  30 tablet; Refill: 1  2. Viral URI with cough Focus is on supportive care, see meds prescribed below. If not getting better in 3-4 days he will call and I will happy to send in an abx. - ipratropium (ATROVENT) 0.03 % nasal spray; Place 2 sprays into both nostrils 2 (two) times daily.  Dispense: 30 mL; Refill: 0 - chlorpheniramine-HYDROcodone  (TUSSIONEX PENNKINETIC ER) 10-8 MG/5ML LQCR; Take 5 mLs by mouth every 12 (twelve) hours as needed for cough (cough).  Dispense: 80 mL; Refill: 0   Nicole V. Dyke Brackett, MHS [facility]  06/04/2014

## 2014-06-04 ENCOUNTER — Telehealth: Payer: Self-pay

## 2014-06-04 MED ORDER — HYDROCOD POLST-CHLORPHEN POLST 10-8 MG/5ML PO LQCR: 5.0000 mL | Freq: Two times a day (BID) | ORAL | Status: DC | PRN

## 2014-06-04 NOTE — Telephone Encounter (Signed)
Called pt using [employer] to advise we have the paper Rx for cough med here to pick up if he needs it. He advised that it was already at the pharmacy. This one here must be a duplicate copy, and I asked Delaney Meigsamara to shred it.

## 2014-12-19 ENCOUNTER — Ambulatory Visit (INDEPENDENT_AMBULATORY_CARE_PROVIDER_SITE_OTHER): Payer: BLUE CROSS/BLUE SHIELD | Admitting: Family Medicine

## 2014-12-19 VITALS — BP 110/74 | HR 60 | Temp 98.3°F | Resp 18 | Ht 68.25 in | Wt 150.8 lb

## 2014-12-19 DIAGNOSIS — N529 Male erectile dysfunction, unspecified: Secondary | ICD-10-CM

## 2014-12-19 DIAGNOSIS — Z0000 Encounter for general adult medical examination without abnormal findings: Secondary | ICD-10-CM | POA: Diagnosis not present

## 2014-12-19 DIAGNOSIS — F411 Generalized anxiety disorder: Secondary | ICD-10-CM | POA: Diagnosis not present

## 2014-12-19 DIAGNOSIS — B360 Pityriasis versicolor: Secondary | ICD-10-CM | POA: Diagnosis not present

## 2014-12-19 NOTE — Patient Instructions (Signed)
You appear to be very healthy on your physical examination  Continue to get regular exercise  Make sure that you take time and have plenty of foreplay before sexual function. If you do not take enough time with your spell's to enjoy each other, is much more difficult to get a good erection. Learn to communicate with each other so that you do the things that each other enjoys when having sex.  I will let you know the results of your lipids (cholesterol and triglycerides) in a few days.  Make sure you always drink plenty of fluids. That will help to avoid having dizziness.  If the anxiety gets worse we can talk about medications. However if you are going to be boxing, I think we need to be very careful about giving any medicines that might slow your reflexes.  Return in one year or as needed

## 2014-12-19 NOTE — Progress Notes (Signed)
Patient ID: Theodore Bishop, male    DOB: 03/23/1985  Age: 29 y.o. MRN: [id number]  Chief Complaint  Patient presents with  . Annual Exam    Subjective:  History: 29 year old man who is here for his annual physical examination. He has no major problems. He had some little issues he wants to discuss. He is very fearful of anything that will bring extra charges to his bill. We talked about a lot of things. He has been married for about a year and a half, but his wife just got to the [place] from [place] 4 months ago. He has had some instances where he has had problems getting a good erection. He says his mind is busy thinking about other things that has distracted him. We had a long talk about the need for appropriate time and foreplay for his sexual activity. He gets anxious. 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 We talked about flight and fight responses, and that that was shortly probably a part of him feeling this way.  Past medical history: Surgery: Bilateral inguinal herniorrhaphy Medical illnesses: None Regular medications: None Allergies: None   Social history: As above he recently got married. He does box. He works as a Psychologist, occupational.  Family history: Both parents are living and well. No major familial diseases  Review of systems Constitutional: Unremarkable HEENT: Unremarkable Cardiovascular: Unremarkable Respiratory: Unremarkable GI: Unremarkable GU: Unremarkable Musculoskeletal: Unremarkable Dermatologic: Unremarkable. He does have a little variable skin coloration on his back that could be tinea versicolor. Psychiatric: Anxiety as noted above Neurologic: Unremarkable Endocrinologic: Unremarkable  Current allergies, medications, problem list, past/family and social histories reviewed.  Objective:  BP 110/74 mmHg  Pulse 60  Temp(Src) 98.3 F (36.8 C) (Oral)  Resp 18  Ht 5' 8.25" (1.734 m)  Wt 150  lb 12.8 oz (68.402 kg)  BMI 22.75 kg/m2  SpO2 99% Physical examination: Healthy young man in no acute distress. TMs normal. Eyes PERRLA. Fundi benign. He does have a pterygium on the left eye. Throat was clear. Teeth satisfactory. Neck supple without nodes or thyromegaly. Chest clear. Heart regular without murmurs. Abdomen soft without mass or tenderness. Normal male external genitalia, uncircumcised. Testes descended. No hernias. Extremities unremarkable. Skin has some verbal coloration on his back which could be tinea versicolor    Assessment & Plan:   Assessment: 1. Annual physical exam   2. Erectile dysfunction, unspecified erectile dysfunction type   3. Generalized anxiety disorder   4. Tinea versicolor       Plan:  Orders Placed This Encounter  Procedures  . Lipid panel      Patient Instructions  You appear to be very healthy on your physical examination  Continue to get regular exercise  Make sure that you take time and have plenty of foreplay before sexual function. If you do not take enough time with your spell's to enjoy each other, is much more difficult to get a good erection. Learn to communicate with each other so that you do the things that each other enjoys when having sex.  I will let you know the results of your lipids (cholesterol and triglycerides) in a few days.  Make sure you always drink plenty of fluids. That will help to avoid having dizziness.  If the anxiety gets worse we can talk about medications. However if you are going to be boxing, I think we need to be  very careful about giving any medicines that might slow your reflexes.  Return in one year or as needed     Return in about 1 year (around 12/19/2015).   Arshiya Jakes, MD 12/19/2014

## 2014-12-20 LAB — LIPID PANEL
CHOLESTEROL: 143 mg/dL (ref 125–200)
HDL: 32 mg/dL — ABNORMAL LOW (ref >=40)
LDL CALC: 93 mg/dL (ref <130)
Total CHOL/HDL Ratio: 4.5 Ratio (ref <=5.0)
Triglycerides: 91 mg/dL (ref <150)
VLDL: 18 mg/dL (ref <30)

## 2015-05-14 ENCOUNTER — Ambulatory Visit (INDEPENDENT_AMBULATORY_CARE_PROVIDER_SITE_OTHER): Payer: BLUE CROSS/BLUE SHIELD | Admitting: Physician Assistant

## 2015-05-14 VITALS — BP 128/80 | HR 81 | Temp 98.4°F | Resp 16 | Ht 68.0 in | Wt 158.4 lb

## 2015-05-14 DIAGNOSIS — J111 Influenza due to unidentified influenza virus with other respiratory manifestations: Secondary | ICD-10-CM | POA: Diagnosis not present

## 2015-05-14 DIAGNOSIS — H11002 Unspecified pterygium of left eye: Secondary | ICD-10-CM | POA: Diagnosis not present

## 2015-05-14 DIAGNOSIS — R69 Illness, unspecified: Principal | ICD-10-CM

## 2015-05-14 MED ORDER — HYDROCODONE-HOMATROPINE 5-1.5 MG/5ML PO SYRP: 2.5000 mL | ORAL_SOLUTION | Freq: Every evening | ORAL | Status: DC | PRN

## 2015-05-14 MED ORDER — NAPROXEN 375 MG PO TABS: 375.0000 mg | ORAL_TABLET | Freq: Two times a day (BID) | ORAL | Status: DC

## 2015-05-14 MED ORDER — CETIRIZINE-PSEUDOEPHEDRINE ER 5-120 MG PO TB12: 1.0000 | ORAL_TABLET | Freq: Two times a day (BID) | ORAL | Status: DC

## 2015-05-14 NOTE — Progress Notes (Signed)
   05/18/2015 2:22 PM   DOB: [birth year] / MRN: [id number]  SUBJECTIVE:  Theodore Bishop is a 30 y.o. male presenting for for the evaluation of headache that started 4 days ago. He associates HA and muscle aches.   He denies fever and difficulty breathing. Treatments tried thus far include several OTC preps with fair to poor relief. He reports sick contacts.  He has No Known Allergies.   He  has a past medical history of Sinusitis.    He  reports that he has quit smoking. His smoking use included Cigarettes. He smoked 0.00 packs per day for 9 years. He has never used smokeless tobacco. He reports that he does not drink alcohol or use illicit drugs. He  reports that he currently engages in sexual activity. The patient  has past surgical history that includes Hernia repair.  His family history includes Cancer in his mother.  Review of Systems  Constitutional: Negative for fever.  Respiratory: Negative for shortness of breath.   Cardiovascular: Negative for chest pain.  Musculoskeletal: Positive for myalgias.  Skin: Negative for rash.  Neurological: Negative for dizziness.    Problem list and medications reviewed and updated by myself where necessary, and exist elsewhere in the encounter.   OBJECTIVE:  BP 128/80 mmHg  Pulse 81  Temp(Src) 98.4 F (36.9 C) (Oral)  Resp 16  Ht  (1.727 m)  Wt 158 lb 6.4 oz (71.85 kg)  BMI 24.09 kg/m2  SpO2 98%  Physical Exam  Constitutional: He is oriented to person, place, and time. He appears well-nourished. No distress.  HENT:  Right Ear: Hearing and tympanic membrane normal.  Left Ear: Hearing and tympanic membrane normal.  Nose: Mucosal edema present. No sinus tenderness. Right sinus exhibits no maxillary sinus tenderness and no frontal sinus tenderness. Left sinus exhibits no maxillary sinus tenderness and no frontal sinus tenderness.  Mouth/Throat: Uvula is midline, oropharynx is clear and moist and mucous membranes are normal.  Eyes: EOM  are normal. Pterygium noted about the left medial sclera and iris.   Cardiovascular: Normal rate.   Pulmonary/Chest: Effort normal.  Abdominal: He exhibits no distension.  Neurological: He is alert and oriented to person, place, and time. No cranial nerve deficit. Skin: Skin is dry. He is not diaphoretic.  Vitals reviewed.  No results found for this or any previous visit (from the past 48 hour(s)).  ASSESSMENT AND PLAN:  Doil was seen today for cough, sore throat and headache.  Diagnoses and all orders for this visit:  Influenza-like illness -     naproxen (NAPROSYN) 375 MG tablet; Take 1 tablet (375 mg total) by mouth 2 (two) times daily with a meal. -     HYDROcodone-homatropine (HYCODAN) 5-1.5 MG/5ML syrup; Take 2.5-5 mLs by mouth at bedtime as needed. -     cetirizine-pseudoephedrine (ZYRTEC-D ALLERGY & CONGESTION) 5-120 MG tablet; Take 1 tablet by mouth 2 (two) times daily.  Pterygium eye, left -     Ambulatory referral to Ophthalmology    The patient was advised to call or return to clinic if he does not see an improvement in symptoms or to seek the care of the closest emergency department if he worsens with the above plan.   Deliah Boston, MHS, PA-C [facility] 05/18/2015 2:22 PM

## 2016-01-06 ENCOUNTER — Ambulatory Visit (INDEPENDENT_AMBULATORY_CARE_PROVIDER_SITE_OTHER): Payer: BLUE CROSS/BLUE SHIELD | Admitting: Family Medicine

## 2016-01-06 VITALS — BP 118/80 | HR 50 | Temp 98.5°F | Resp 17 | Ht 68.0 in | Wt 155.0 lb

## 2016-01-06 DIAGNOSIS — Z0000 Encounter for general adult medical examination without abnormal findings: Secondary | ICD-10-CM

## 2016-01-06 MED ORDER — TRIAMCINOLONE ACETONIDE 0.1 % EX CREA: 1.0000 "application " | TOPICAL_CREAM | Freq: Two times a day (BID) | CUTANEOUS | 0 refills | Status: DC

## 2016-01-06 NOTE — Patient Instructions (Addendum)
It was good to meet you today.  I have sent in the medication for your back.   I have completed your paperwork for work.      IF you received an x-ray today, you will receive an invoice from [facility]. Please contact [facility] at [phone] with questions or concerns regarding your invoice.   IF you received labwork today, you will receive an invoice from [facility]. Please contact [facility] at [phone] with questions or concerns regarding your invoice.   Our billing staff will not be able to assist you with questions regarding bills from these companies.  You will be contacted with the lab results as soon as they are available. The fastest way to get your results is to activate your My Chart account. Instructions are located on the last page of this paperwork. If you have not heard from us regarding the results in 2 weeks, please contact this office.

## 2016-01-06 NOTE — Progress Notes (Signed)
   Theodore Bishop is a 30 y.o. male who presents to [facility] today for work examination:  CPE  Concerns:  Dark spot on his back. Present for years.  Improving somewhat with hydrocortisone.  Otherwise none.    Last physical never.  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  Has had some mild pain in his left arm, better with ibuprofen.  Describes as mild soreness.  No injuries.   Eye exam never.;  No glasses. Dental exam every six months.   PMH reviewed. Patient is a nonsmoker.   Past Medical History:  Diagnosis Date  . Depression   . Sinusitis    Past Surgical History:  Procedure Laterality Date  . HERNIA REPAIR    . RHINOPLASTY      Medications reviewed. Current Outpatient Prescriptions  Medication Sig Dispense Refill  . ibuprofen (ADVIL,MOTRIN) 200 MG tablet Take 200 mg by mouth every 6 (six) hours as needed.    Marland Kitchen. HYDROcodone-homatropine (HYCODAN) 5-1.5 MG/5ML syrup Take 2.5-5 mLs by mouth at bedtime as needed. (Patient not taking: Reported on 01/06/2016) 60 mL 0  . naproxen (NAPROSYN) 375 MG tablet Take 1 tablet (375 mg total) by mouth 2 (two) times daily with a meal. (Patient not taking: Reported on 01/06/2016) 30 tablet 0   No current facility-administered medications for this visit.     Social: Smoking history:  none Alcohol use:  denies Illicit drug use:  denies Works as a Psychologist, occupationalwelder  Review of Systems  Constitutional: Negative for fever.  HENT: Negative for congestion, ear discharge, ear pain and hearing loss.   Eyes: Negative for blurred vision.  Respiratory: Negative for cough and wheezing.   Cardiovascular: Negative for chest pain, palpitations and leg swelling.  Gastrointestinal: Negative for nausea, vomiting and abdominal pain.  Genitourinary: Negative for dysuria, hematuria and flank pain.  Musculoskeletal: Negative for neck pain.  Skin: Negative for rash.  Neurological: Negative for dizziness and headaches.    Psychiatric/Behavioral: Negative for depression and suicidal ideas.   Exam: BP 118/80 (BP Location: Right Arm, Patient Position: Sitting, Cuff Size: Normal)   Pulse (!) 50   Temp 98.5 F (36.9 C) (Oral)   Resp 17   Ht 5\' 8"  (1.727 m)   Wt 155 lb (70.3 kg)   SpO2 99%   BMI 23.57 kg/m  Gen:  Alert, cooperative patient who appears stated age in no acute distress.  Vital signs reviewed. Head: [place]/AT.   Eyes:  EOMI, PERRL.   Ears:  External ears WNL, Bilateral TM's normal without retraction, redness or bulging. Nose:  Septum midline  Mouth:  MMM, tonsils non-erythematous, non-edematous.   Neck: No masses or thyromegaly or limitation in range of motion.  No cervical lymphadenopathy. Pulm:  Clear to auscultation bilaterally with good air movement.  No wheezes or rales noted.   Cardiac:  Regular rate and rhythm without murmur auscultated.  Good S1/S2. Abd:  Soft/nondistended/nontender.  Good bowel sounds throughout all four quadrants.  No masses noted.  Ext:  No LE edema Skin:  Hyperpigmented area scattered across upper back.  Neuro:  Grossly normal, no gait abnormalities Psych:  Not depressed or anxious appearing.  Conversant and engaged  Impression/Plan: 1. Work exam:  Normal apperaing male appears stated age.  No concerns.  He states he had lipids drawn at work.  Completed work form.  2.  SKin discoloration:  Triamcinolone to treat.  Has been using hydrocortisone with some relief.

## 2016-04-15 ENCOUNTER — Ambulatory Visit (INDEPENDENT_AMBULATORY_CARE_PROVIDER_SITE_OTHER): Payer: BLUE CROSS/BLUE SHIELD | Admitting: Family Medicine

## 2016-04-15 VITALS — BP 120/74 | HR 64 | Temp 98.4°F | Resp 18 | Ht 68.0 in | Wt 158.8 lb

## 2016-04-15 DIAGNOSIS — S46811A Strain of other muscles, fascia and tendons at shoulder and upper arm level, right arm, initial encounter: Secondary | ICD-10-CM

## 2016-04-15 DIAGNOSIS — M542 Cervicalgia: Secondary | ICD-10-CM | POA: Diagnosis not present

## 2016-04-15 MED ORDER — MELOXICAM 15 MG PO TABS: 15.0000 mg | ORAL_TABLET | Freq: Every day | ORAL | 0 refills | Status: DC

## 2016-04-15 NOTE — Progress Notes (Signed)
  Chief Complaint  Patient presents with  . 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 Depression   . Sinusitis     Current Outpatient Prescriptions  Medication Sig Dispense Refill  . meloxicam (MOBIC) 15 MG tablet Take 1 tablet (15 mg total) by mouth daily. 30 tablet 0   No current facility-administered medications for this visit.     Allergies: No Known Allergies  Past Surgical History:  Procedure Laterality Date  . HERNIA REPAIR    . RHINOPLASTY      Social History   Social History  . Marital status: Married    Spouse name: N/A  . Number of children: N/A  . Years of education: N/A   Social History Main Topics  . Smoking status: Former Smoker    Packs/day: 0.00    Years: 9.00    Types: Cigarettes  . Smokeless tobacco: Never Used     Comment: smokes 1 cigarette per week  . Alcohol use No     Comment: quit x 1 month - previously drank 12pk every weekend  . Drug use: No  . Sexual activity: Yes   Other Topics Concern  . None   Social History Narrative  . None    ROS  Objective: Vitals:   04/15/16 1659  BP: 120/74  Pulse: 64  Resp: 18  Temp: 98.4 F (36.9 C)  TempSrc: Oral  SpO2: 99%  Weight: 158 lb 12.8 oz (72 kg)  Height: 5\' 8"  (1.727 m)    Physical Exam  Constitutional: He is oriented to person, place, and time. He appears well-developed and well-nourished.  HENT:  Head: Normocephalic and atraumatic.    Eyes: Conjunctivae and EOM are normal.  Cardiovascular: Normal  rate, regular rhythm and normal heart sounds.   Pulmonary/Chest: Effort normal and breath sounds normal. No respiratory distress. He has no wheezes.  Neurological: He is alert and oriented to person, place, and time.    Assessment and Plan Theodore Bishop was seen today for neck pain.  Diagnoses and all orders for this visit:  Strain of levator scapulae muscle, right, initial encounter Trigger point with neck pain  Discussed that he should apply aspercreme topically and take meloxicam daily  Because of the point tenderness it is concerning for a trigger point  Discussed that he should also stretch -     meloxicam (MOBIC) 15 MG tablet; Take 1 tablet (15 mg total) by mouth daily.     [name] A Alissandra Geoffroy

## 2016-04-15 NOTE — Patient Instructions (Addendum)
Apply aspercreme to the base of the neck as needed for pain   IF you received an x-ray today, you will receive an invoice from [facility]. Please contact [facility] at [phone] with questions or concerns regarding your invoice.   IF you received labwork today, you will receive an invoice from [place]. Please contact [facility] at [phone] with questions or concerns regarding your invoice.   Our billing staff will not be able to assist you with questions regarding bills from these companies.  You will be contacted with the lab results as soon as they are available. The fastest way to get your results is to activate your My Chart account. Instructions are located on the last page of this paperwork. If you have not heard from [place] regarding the results in 2 weeks, please contact this office.      Distensin muscular. (Muscle Strain) Una distensin muscular (estiramiento muscular) ocurre cuando un msculo se estira ms all de la longitud normal. Reece Agar cuando una fuerza violenta bruscamente estira demasiado el msculo. Generalmente se desgarran algunas de las fibras del msculo. La distensin muscular es comn en los atletas. La recuperacin normalmente tarda de 1 a 2semanas. La curacin completa tarda de 5 a 6semanas. CUIDADOS EN EL HOGAR  Siga el mtodo PRICE (por sus siglas en ingls) de tratamiento para que la lesin mejore. Hgalo durante los 2 a 3 primeros das despus de la lesin:  Licensed conveyancer. Proteja el msculo para evitar que se vuelva a lesionar.  Reposo. Limite la actividad y descanse la parte del cuerpo lesionada.  Hielo. Ponga el hielo en una bolsa plstica. Coloque una toalla entre la piel y la bolsa de hielo. Luego aplique el hielo y djelo actuar de 15 a por hora. Despus del Press photographer, cambie a compresas de calor hmedo.  Compresin. Use una frula o venda elstica en la zona lesionada para brindar alivio. No la ajuste  demasiado.  Elevacin. Eleve la zona lesionada por encima del nivel del corazn.  Solo tome los medicamentos que le haya indicado su mdico.  Realice un calentamiento antes de hacer ejercicio para prevenir distensiones musculares futuras. SOLICITE AYUDA SI:  Siente ms dolor o inflamacin (hinchazn) en la zona lesionada.  Siente adormecimiento, hormigueo o nota una prdida de fuerza en la zona lesionada. ASEGRESE DE QUE:  Comprende estas instrucciones.  Controlar su afeccin.  Recibir ayuda de inmediato si no mejora o si empeora. Esta informacin no tiene Theme park manager el consejo del mdico. Asegrese de hacerle al mdico cualquier pregunta que tenga. Document Released: 05/27/2008 Document Revised: 12/19/2012 Document Reviewed: 09/27/2012 Elsevier Interactive Patient Education  2017 Elsevier Inc.  Trigger Point Injection Trigger points are areas where you have pain. A trigger point injection is a shot given in the trigger point to help relieve pain for a few days to a few months. Common places for trigger points include:  The neck.  The shoulders.  The upper back.  The lower back. A trigger point injection will not cure long-lasting (chronic) pain permanently. These injections do not always work for every person, but for some people they can help to relieve pain for a few days to a few months. Tell a health care provider about:  Any allergies you have.  All medicines you are taking, including vitamins, herbs, eye drops, creams, and over-the-counter medicines.  Any problems you or family members have had with anesthetic medicines.  Any blood disorders you have.  Any surgeries you have had.  Any  medical conditions you have. What are the risks? Generally, this is a safe procedure. However, problems may occur, including:  Infection.  Bleeding.  Allergic reaction to the injected medicine.  Irritation of the skin around the injection site. What happens  before the procedure?  Ask your health care provider about changing or stopping your regular medicines. This is especially important if you are taking diabetes medicines or blood thinners. What happens during the procedure?  Your health care provider will feel for trigger points. A marker may be used to circle the area for the injection.  The skin over the trigger point will be washed with a germ-killing (antiseptic) solution.  A thin needle is used for the shot. You may feel pain or a twitching feeling when the needle enters the trigger point.  A numbing solution may be injected into the trigger point. Sometimes a medicine to keep down swelling, redness, and warmth (inflammation) is also injected.  Your health care provider may move the needle around the area where the trigger point is located until the tightness and twitching goes away.  After the injection, your health care provider may put gentle pressure over the injection site.  The injection site will be covered with a bandage (dressing). The procedure may vary among health care providers and hospitals. What happens after the procedure?  The dressing can be taken off in a few hours or as told by your health care provider.  You may feel sore and stiff for 1-2 days. This information is not intended to replace advice given to you by your health care provider. Make sure you discuss any questions you have with your health care provider. Document Released: 02/17/2011 Document Revised: 11/01/2015 Document Reviewed: 08/18/2014 Elsevier Interactive Patient Education  2017 [employer]

## 2016-04-21 NOTE — Addendum Note (Signed)
Addended byGwendolyn Grant: Laporsche Hoeger H on: 04/21/2016 03:06 PM   Modules accepted: Level of Service

## 2016-06-02 ENCOUNTER — Ambulatory Visit (INDEPENDENT_AMBULATORY_CARE_PROVIDER_SITE_OTHER): Payer: BLUE CROSS/BLUE SHIELD | Admitting: Physician Assistant

## 2016-06-02 VITALS — BP 110/68 | HR 53 | Temp 98.7°F | Ht 68.0 in | Wt 162.8 lb

## 2016-06-02 DIAGNOSIS — R300 Dysuria: Secondary | ICD-10-CM

## 2016-06-02 DIAGNOSIS — R102 Pelvic and perineal pain: Secondary | ICD-10-CM

## 2016-06-02 DIAGNOSIS — S161XXD Strain of muscle, fascia and tendon at neck level, subsequent encounter: Secondary | ICD-10-CM

## 2016-06-02 LAB — POCT URINALYSIS DIP (MANUAL ENTRY)
Bilirubin, UA: NEGATIVE
GLUCOSE UA: NEGATIVE
Ketones, POC UA: NEGATIVE
LEUKOCYTES UA: NEGATIVE
NITRITE UA: NEGATIVE
PH UA: 7 (ref 5.0–8.0)
Protein Ur, POC: NEGATIVE
Spec Grav, UA: 1.015 (ref 1.030–1.035)
UROBILINOGEN UA: 0.2 (ref ?–2.0)

## 2016-06-02 LAB — POC MICROSCOPIC URINALYSIS (UMFC): Mucus: ABSENT

## 2016-06-02 MED ORDER — CYCLOBENZAPRINE HCL 5 MG PO TABS: 5.0000 mg | ORAL_TABLET | Freq: Every evening | ORAL | 0 refills | Status: DC | PRN

## 2016-06-02 MED ORDER — PHENAZOPYRIDINE HCL 200 MG PO TABS: 200.0000 mg | ORAL_TABLET | Freq: Three times a day (TID) | ORAL | 0 refills | Status: DC | PRN

## 2016-06-02 NOTE — Progress Notes (Signed)
Theodore Bishop  MRN: [id number] DOB: 02/10/1986  PCP: No PCP Per Patient  Chief Complaint  Patient presents with  . Abdominal Pain    X 2 days  . Groin Pain     X 1 week- pt states its been off and on during urination    Subjective:  Pt presents to clinic for suprapubic pain for the last 2 days - some dysuria 2 days ago that has resolved. He has some pressure in his lower abd - intermittent - does not seem to be related to anything.   1 Single male partner - no condoms  stool is soft and regular - no pain with passing stools   He was also seen on 2/2 for muscle strain and he is not better from that.  The muscle feels tight and the mobic is not really helping the pain.  Review of Systems  Constitutional: Negative for chills and fever.  Gastrointestinal: Negative for nausea.  Genitourinary: Positive for frequency. Negative for dysuria, penile pain, scrotal swelling and testicular pain.    There are no active problems to display for this patient.   Current Outpatient Prescriptions on File Prior to Visit  Medication Sig Dispense Refill  . meloxicam (MOBIC) 15 MG tablet Take 1 tablet (15 mg total) by mouth daily. 30 tablet 0   No current facility-administered medications on file prior to visit.     No Known Allergies  Pt patients past, family and social history were reviewed and updated.   Objective:  BP 110/68 (BP Location: Right Arm, Patient Position: Sitting, Cuff Size: Small)   Pulse (!) 53   Temp 98.7 F (37.1 C) (Oral)   Ht 5\' 8"  (1.727 m)   Wt 162 lb 12.8 oz (73.8 kg)   SpO2 98%   BMI 24.75 kg/m   Physical Exam  Constitutional: He is oriented to person, place, and time and well-developed, well-nourished, and in no distress.  HENT:  Head: Normocephalic and atraumatic.  Right Ear: External ear normal.  Left Ear: External ear normal.  Eyes: Conjunctivae are normal.  Neck: Normal range of motion.  Cardiovascular: Normal rate, regular rhythm and normal  heart sounds.   No murmur heard. Pulmonary/Chest: Effort normal and breath sounds normal. He has no wheezes.  Abdominal: Hernia confirmed negative in the right inguinal area and confirmed negative in the left inguinal area.  Genitourinary: Testes/scrotum normal and penis normal.  Neurological: He is alert and oriented to person, place, and time. Gait normal.  Skin: Skin is warm and dry.  Psychiatric: Mood, memory, affect and judgment normal.   Results for orders placed or performed in visit on 06/02/16  GC/Chlamydia Probe Amp  Result Value Ref Range   Chlamydia trachomatis, NAA Negative Negative   Neisseria gonorrhoeae by PCR Negative Negative  Trichomonas vaginalis, RNA  Result Value Ref Range   Trich vag by NAA Negative Negative  POCT urinalysis dipstick  Result Value Ref Range   Color, UA yellow yellow   Clarity, UA clear clear   Glucose, UA negative negative   Bilirubin, UA negative negative   Ketones, POC UA negative negative   Spec Grav, UA 1.015 1.030 - 1.035   Blood, UA trace-intact (A) negative   pH, UA 7.0 5.0 - 8.0   Protein Ur, POC negative negative   Urobilinogen, UA 0.2 Negative - 2.0   Nitrite, UA Negative Negative   Leukocytes, UA Negative Negative  POCT Microscopic Urinalysis ([facility])  Result Value Ref Range   WBC,UR,HPF,POC  None None WBC/hpf   RBC,UR,HPF,POC None None RBC/hpf   Bacteria None None, Too numerous to count   Mucus Absent Absent   Epithelial Cells, UR Per Microscopy Few (A) None, Too numerous to count cells/hpf      Assessment and Plan :  Dysuria - Plan: GC/Chlamydia Probe Amp, POCT urinalysis dipstick, POCT Microscopic Urinalysis ([facility]), phenazopyridine (PYRIDIUM) 200 MG tablet, Trichomonas vaginalis, RNA - check labs unsure of cause at this time - pt will continue to hydrate and will treat with pyridium to help with bladder spasm while waiting on the lab results.  Suprapubic abdominal pain  Strain of neck muscle, subsequent encounter -  Plan: cyclobenzaprine (FLEXERIL) 5 MG tablet trial of muscle relaxer at night to help with the pain and discomfort    Benny LennertSarah Weber PA-C  Primary [facility] at [facility] 06/07/2016 9:40 PM

## 2016-06-02 NOTE — Patient Instructions (Addendum)
Continue drinking a good amount of water.  If you notice that your stool starts to get hard please add a stool softner.  We are waiting on more labs with your urine and depending on the results you may need some additional medications.  I have given you a medication to help with bladder spasms while we are waiting on lab results.  I have also given you a muscle relaxer for your neck pain - also try heat as that may help with your discomfort.    IF you received an x-ray today, you will receive an invoice from [facility]. Please contact [facility] at [phone] with questions or concerns regarding your invoice.   IF you received labwork today, you will receive an invoice from [facility]. Please contact [facility] at [phone] with questions or concerns regarding your invoice.   Our billing staff will not be able to assist you with questions regarding bills from these companies.  You will be contacted with the lab results as soon as they are available. The fastest way to get your results is to activate your My Chart account. Instructions are located on the last page of this paperwork. If you have not heard from us regarding the results in 2 weeks, please contact this office.

## 2016-06-04 LAB — GC/CHLAMYDIA PROBE AMP
CHLAMYDIA, DNA PROBE: NEGATIVE
Neisseria gonorrhoeae by PCR: NEGATIVE

## 2016-06-04 LAB — TRICHOMONAS VAGINALIS, PROBE AMP: Trich vag by NAA: NEGATIVE

## 2016-06-10 ENCOUNTER — Encounter: Payer: Self-pay | Admitting: *Deleted

## 2016-07-15 IMAGING — CR DG SHOULDER 2+V*L*
3 series · 3 of 3 positions shown · non-contrast
Comparison: 04/23/2013 chest x-ray.

CLINICAL DATA: Left shoulder pain. Chronic, for 6-8 months. No
known injury.

EXAM:
LEFT SHOULDER - 2+ VIEW

[AP]
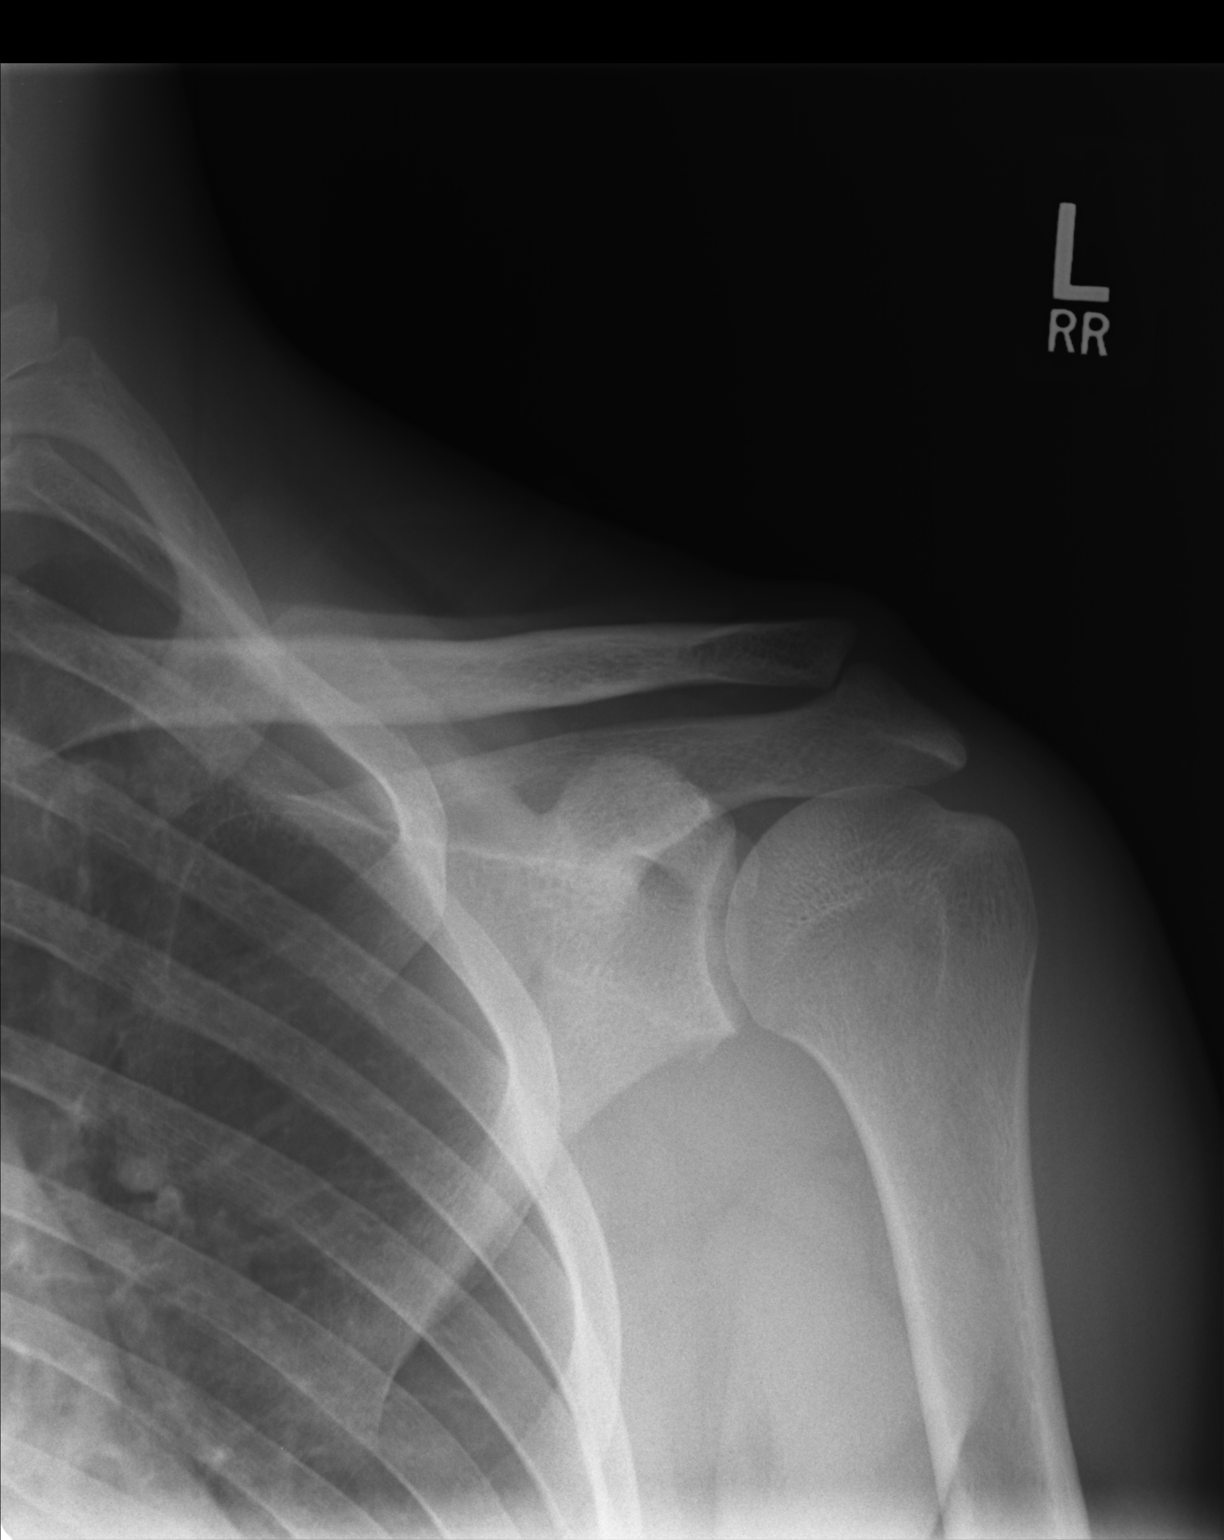

[pa y view]
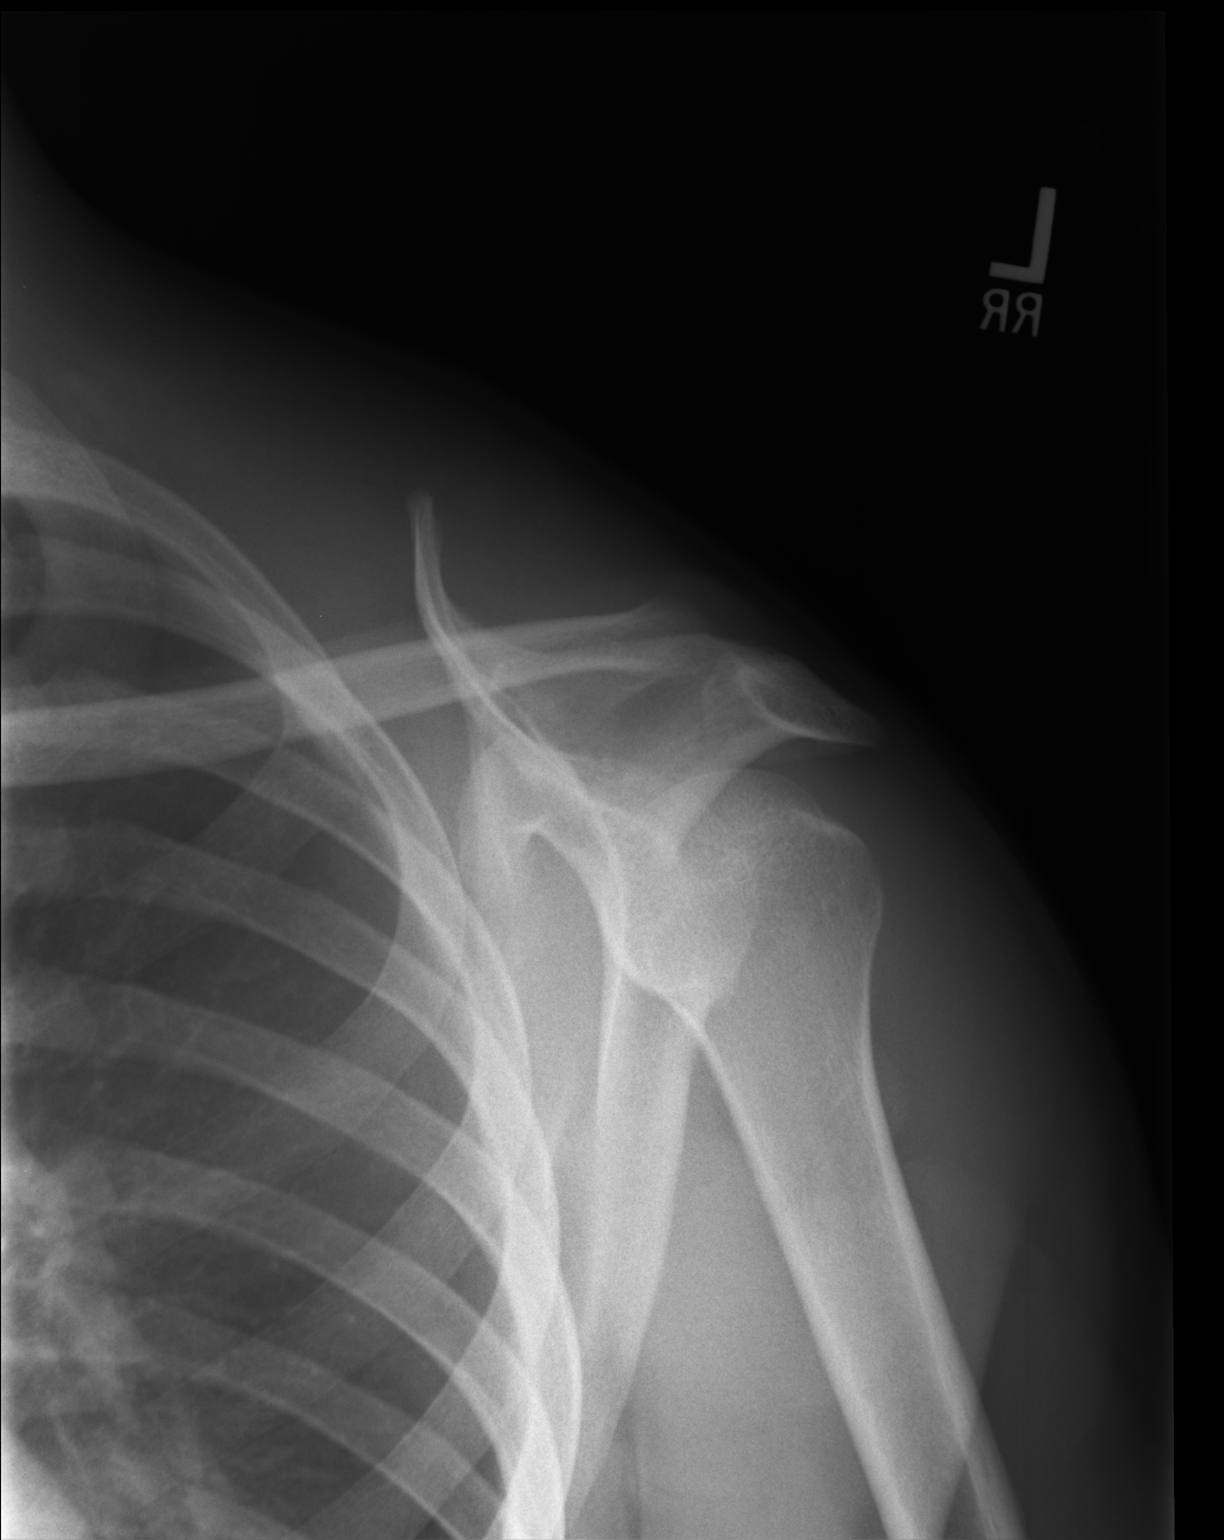

[axial]
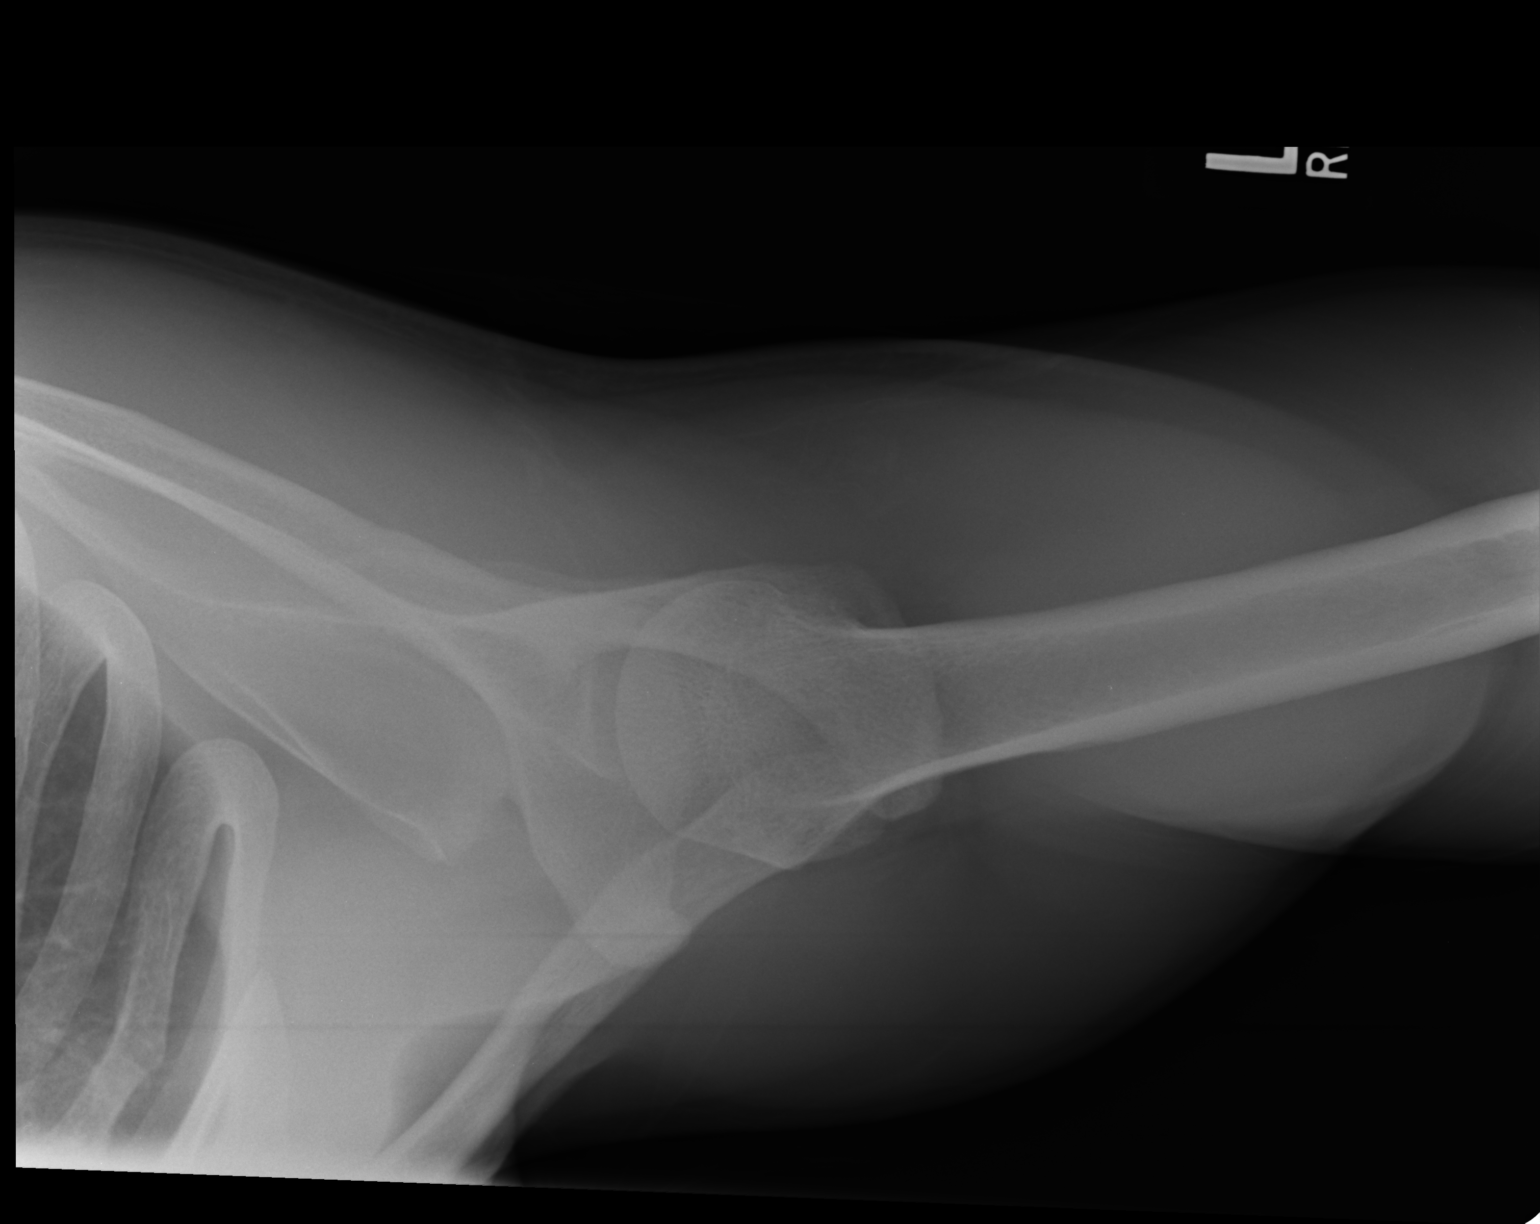

[3 of 3 positions shown; findings below may reference images not displayed]

FINDINGS: There is no evidence of fracture or dislocation. There is no
evidence of arthropathy or other focal bone abnormality. Soft
tissues are unremarkable.
IMPRESSION: Negative.

## 2017-02-03 ENCOUNTER — Encounter: Payer: Self-pay | Admitting: Family Medicine

## 2017-02-03 ENCOUNTER — Encounter: Payer: BLUE CROSS/BLUE SHIELD | Admitting: Family Medicine

## 2017-02-03 ENCOUNTER — Ambulatory Visit (INDEPENDENT_AMBULATORY_CARE_PROVIDER_SITE_OTHER): Payer: BLUE CROSS/BLUE SHIELD | Admitting: Family Medicine

## 2017-02-03 ENCOUNTER — Other Ambulatory Visit: Payer: Self-pay

## 2017-02-03 VITALS — BP 124/78 | HR 80 | Temp 98.1°F | Ht 69.29 in | Wt 154.6 lb

## 2017-02-03 DIAGNOSIS — Z0000 Encounter for general adult medical examination without abnormal findings: Secondary | ICD-10-CM | POA: Diagnosis not present

## 2017-02-03 NOTE — Progress Notes (Signed)
11/23/20181:18 PM  Theodore Bishop 10/12/1985, 31 y.o. male [id number]  Chief Complaint  Patient presents with  . Employment Physical    HPI:   Patient is a 31 y.o. male with no past medical history who presents today for annual physical exam per employer. Labs drawn at work are reviewed today. 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 Exercises regularly. Has cut down on smoking and drinking. Has no acute concerns today.  Depression screen [facility] 2/9 02/03/2017 06/02/2016 04/15/2016  Decreased Interest 1 0 0  Down, Depressed, Hopeless 1 0 0  PHQ - 2 Score 2 0 0  Altered sleeping 2 - -  Tired, decreased energy 0 - -  Change in appetite 0 - -  Feeling bad or failure about yourself  1 - -  Trouble concentrating 0 - -  Moving slowly or fidgety/restless 0 - -  Suicidal thoughts 0 - -  PHQ-9 Score 5 - -  Difficult doing work/chores Not difficult at all - -    No Known Allergies  Prior to Admission medications   Not on File    Past Medical History:  Diagnosis Date  . Depression   . Sinusitis     Past Surgical History:  Procedure Laterality Date  . HERNIA REPAIR    . NOSE SURGERY N/A 2013  . RHINOPLASTY      Social History   Tobacco Use  . Smoking status: Former Smoker    Packs/day: 0.00    Years: 9.00    Pack years: 0.00    Types: Cigarettes  . Smokeless tobacco: Never Used  . Tobacco comment: smokes 1 cigarette per week  Substance Use Topics  . Alcohol use: No    Comment: quit x 1 month - previously drank 12pk every weekend    Family History  Problem Relation Age of Onset  . Cancer Mother     Review of Systems  Constitutional: Negative for chills, fever and malaise/fatigue.  HENT: Negative for congestion, hearing loss and sore throat.   Eyes: Negative for blurred vision, double vision and pain.  Respiratory: Negative for cough and shortness of breath.   Cardiovascular: Negative for chest pain, palpitations and leg swelling.    Gastrointestinal: Negative for abdominal pain, nausea and vomiting.  Genitourinary: Negative for dysuria and hematuria.  Musculoskeletal: Negative for myalgias.  Skin: Negative for rash.  Neurological: Negative for dizziness, sensory change, focal weakness and headaches.  Psychiatric/Behavioral: Negative for suicidal ideas. The patient is nervous/anxious and has insomnia.      OBJECTIVE:  Blood pressure 124/78, pulse 80, temperature 98.1 F (36.7 C), height 5' 9.29" (1.76 m), weight 154 lb 9.6 oz (70.1 kg), SpO2 97 %.  Physical Exam  Constitutional: He is oriented to person, place, and time and well-developed, well-nourished, and in no distress.  HENT:  Head: Normocephalic and atraumatic.  Right Ear: Hearing, tympanic membrane, external ear and ear canal normal.  Left Ear: Hearing, tympanic membrane, external ear and ear canal normal.  Mouth/Throat: Oropharynx is clear and moist. No oropharyngeal exudate.  Eyes: Conjunctivae and EOM are normal. Pupils are equal, round, and reactive to light.  Neck: Neck supple. No thyromegaly present.  Cardiovascular: Normal rate, regular rhythm, normal heart sounds and intact distal pulses. Exam reveals no gallop and no friction rub.  No murmur heard. Pulmonary/Chest: Effort normal and breath sounds normal. He has no wheezes. He has no rales.  Abdominal: Soft. Bowel sounds are normal. He exhibits no distension and no  mass. There is no tenderness.  Musculoskeletal: Normal range of motion. He exhibits no edema.  Lymphadenopathy:    He has no cervical adenopathy.  Neurological: He is alert and oriented to person, place, and time. He has normal reflexes. No cranial nerve deficit. Gait normal.  Skin: Skin is warm and dry.  Psychiatric: Mood and affect normal.   CBC, CMP and lipids unremarkable  ASSESSMENT and PLAN  1. Annual physical exam No concerns per history or exam. HCM reviewed/discussed. Anticipatory guidance regarding healthy weight,  lifestyle and choices given.    Return in about 1 year (around 02/03/2018).    Myles LippsIrma M Santiago, MD Primary [facility] at [facility] [address] [place], [place] [address] Ph.  [phone] Fax [phone]

## 2017-02-03 NOTE — Patient Instructions (Addendum)
1. Humificador para el cuarto    IF you received an x-ray today, you will receive an invoice from [facility]. Please contact [facility] at [phone] with questions or concerns regarding your invoice.   IF you received labwork today, you will receive an invoice from [facility]. Please contact [facility] at [phone] with questions or concerns regarding your invoice.   Our billing staff will not be able to assist you with questions regarding bills from these companies.  You will be contacted with the lab results as soon as they are available. The fastest way to get your results is to activate your My Chart account. Instructions are located on the last page of this paperwork. If you have not heard from us regarding the results in 2 weeks, please contact this office.

## 2017-02-10 ENCOUNTER — Ambulatory Visit: Payer: BLUE CROSS/BLUE SHIELD | Admitting: Family Medicine

## 2017-03-23 ENCOUNTER — Ambulatory Visit: Payer: BLUE CROSS/BLUE SHIELD | Admitting: Family Medicine

## 2017-03-23 ENCOUNTER — Other Ambulatory Visit: Payer: Self-pay

## 2017-03-23 VITALS — BP 108/64 | HR 56 | Temp 97.6°F | Ht 69.69 in | Wt 158.6 lb

## 2017-03-23 DIAGNOSIS — Z23 Encounter for immunization: Secondary | ICD-10-CM | POA: Diagnosis not present

## 2017-03-23 DIAGNOSIS — J029 Acute pharyngitis, unspecified: Secondary | ICD-10-CM | POA: Diagnosis not present

## 2017-03-23 DIAGNOSIS — J069 Acute upper respiratory infection, unspecified: Secondary | ICD-10-CM

## 2017-03-23 DIAGNOSIS — H11002 Unspecified pterygium of left eye: Secondary | ICD-10-CM | POA: Diagnosis not present

## 2017-03-23 LAB — POCT RAPID STREP A (OFFICE): Rapid Strep A Screen: NEGATIVE

## 2017-03-23 MED ORDER — BENZONATATE 100 MG PO CAPS: 100.0000 mg | ORAL_CAPSULE | Freq: Two times a day (BID) | ORAL | 0 refills | Status: DC | PRN

## 2017-03-23 MED ORDER — AZELASTINE HCL 0.1 % NA SOLN: 1.0000 | Freq: Two times a day (BID) | NASAL | 0 refills | Status: DC

## 2017-03-23 NOTE — Patient Instructions (Signed)
     IF you received an x-ray today, you will receive an invoice from [facility]. Please contact [facility] at [phone] with questions or concerns regarding your invoice.   IF you received labwork today, you will receive an invoice from [facility]. Please contact [facility] at [phone] with questions or concerns regarding your invoice.   Our billing staff will not be able to assist you with questions regarding bills from these companies.  You will be contacted with the lab results as soon as they are available. The fastest way to get your results is to activate your My Chart account. Instructions are located on the last page of this paperwork. If you have not heard from us regarding the results in 2 weeks, please contact this office.     

## 2017-03-23 NOTE — Progress Notes (Signed)
1/10/20195:31 PM  Theodore Bishop Aug 30, 1985, 32 y.o. male [id number]  Chief Complaint  Patient presents with  . Sore Throat  . Eye Injury    redness of the eye. 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  1. Feels that something feel in his left eye, denies any pain, vision changes, photosensitivity or drainage. He does wear eye protection. He did rinse his eye out at home.  2. Several days of sore throat, runny nose, nasal congestion. No fever, mild cough, no ear pain, no sob, no nausea, no vomiting, no diarrhea  Depression screen [facility] 2/9 03/23/2017 02/03/2017 06/02/2016  Decreased Interest 0 1 0  Down, Depressed, Hopeless 0 1 0  PHQ - 2 Score 0 2 0  Altered sleeping - 2 -  Tired, decreased energy - 0 -  Change in appetite - 0 -  Feeling bad or failure about yourself  - 1 -  Trouble concentrating - 0 -  Moving slowly or fidgety/restless - 0 -  Suicidal thoughts - 0 -  PHQ-9 Score - 5 -  Difficult doing work/chores - Not difficult at all -    No Known Allergies  Prior to Admission medications   Medication Sig Start Date End Date Taking? Authorizing Provider  dextromethorphan-guaiFENesin (MUCINEX DM) 30-600 MG 12hr tablet Take 1 tablet by mouth 2 (two) times daily.   Yes [provider]    Past Medical History:  Diagnosis Date  . Depression   . Sinusitis     Past Surgical History:  Procedure Laterality Date  . HERNIA REPAIR    . NOSE SURGERY N/A 2013  . RHINOPLASTY      Social History   Tobacco Use  . Smoking status: Former Smoker    Packs/day: 0.00    Years: 9.00    Pack years: 0.00    Types: Cigarettes  . Smokeless tobacco: Never Used  . Tobacco comment: smokes 1 cigarette per week  Substance Use Topics  . Alcohol use: No    Comment: quit x 1 month - previously drank 12pk every weekend    Family History  Problem Relation Age of Onset  .  Cancer Mother     ROS Per hpi  OBJECTIVE:  Blood pressure 108/64, pulse (!) 56, temperature 97.6 F (36.4 C), temperature source Oral, height 5' 9.69" (1.77 m), weight 158 lb 9.6 oz (71.9 kg), SpO2 98 %.  Physical Exam  Constitutional: He is oriented to person, place, and time and well-developed, well-nourished, and in no distress.  HENT:  Head: Normocephalic and atraumatic.  Right Ear: Hearing, tympanic membrane, external ear and ear canal normal.  Left Ear: Hearing, tympanic membrane, external ear and ear canal normal.  Mouth/Throat: Oropharynx is clear and moist. No oropharyngeal exudate.  Eyes: Conjunctivae and EOM are normal. Pupils are equal, round, and reactive to light. Lids are everted and swept, no foreign bodies found. No foreign body present in the left eye.  Slit lamp exam:      The left eye shows no fluorescein uptake.    Neck: Neck supple.  Cardiovascular: Normal rate and regular rhythm. Exam reveals no gallop and no friction rub.  No murmur heard. Pulmonary/Chest: Effort normal and breath sounds normal. He has no wheezes. He has no rales.  Lymphadenopathy:    He has no cervical adenopathy.  Neurological: He is alert and  oriented to person, place, and time. Gait normal.  Skin: Skin is warm and dry.    Results for orders placed or performed in visit on 03/23/17 (from the past 24 hour(s))  POCT rapid strep A     Status: None   Collection Time: 03/23/17  5:02 PM  Result Value Ref Range   Rapid Strep A Screen Negative Negative     ASSESSMENT and PLAN  1. URI, acute Discussed supportive measures for URI: increase hydration, rest, humidifier. Medications per below, etc. RTC precautions discussed.  2. Sore throat - POCT rapid strep A - negative  3. Pterygium of left eye Discussed supportive measures.   4. Need for vaccination - Flu Vaccine QUAD 36+ mos IM  Other orders - dextromethorphan-guaiFENesin (MUCINEX DM) 30-600 MG 12hr tablet; Take 1 tablet by  mouth 2 (two) times daily. - azelastine (ASTELIN) 0.1 % nasal spray; Place 1 spray into both nostrils 2 (two) times daily. Use in each nostril as directed - benzonatate (TESSALON) 100 MG capsule; Take 1 capsule (100 mg total) by mouth 2 (two) times daily as needed for cough. - Td vaccine greater than or equal to 7yo preservative free IM  Return if symptoms worsen or fail to improve.    Myles LippsIrma M Santiago, MD [facility] at [facility] [address] [place], [place] [address] Ph.  [phone] Fax [phone]

## 2017-03-23 NOTE — Progress Notes (Signed)
10-4

## 2017-03-29 ENCOUNTER — Encounter: Payer: Self-pay | Admitting: Family Medicine

## 2017-03-31 DIAGNOSIS — Z23 Encounter for immunization: Secondary | ICD-10-CM | POA: Diagnosis not present

## 2017-05-18 ENCOUNTER — Encounter: Payer: Self-pay | Admitting: Family Medicine

## 2017-05-18 ENCOUNTER — Ambulatory Visit: Payer: BLUE CROSS/BLUE SHIELD | Admitting: Family Medicine

## 2017-05-18 ENCOUNTER — Other Ambulatory Visit: Payer: Self-pay

## 2017-05-18 VITALS — BP 120/72 | HR 76 | Temp 98.8°F | Ht 69.69 in | Wt 152.0 lb

## 2017-05-18 DIAGNOSIS — B360 Pityriasis versicolor: Secondary | ICD-10-CM | POA: Diagnosis not present

## 2017-05-18 DIAGNOSIS — M546 Pain in thoracic spine: Secondary | ICD-10-CM | POA: Diagnosis not present

## 2017-05-18 DIAGNOSIS — H11002 Unspecified pterygium of left eye: Secondary | ICD-10-CM | POA: Diagnosis not present

## 2017-05-18 MED ORDER — DICLOFENAC SODIUM 75 MG PO TBEC: 75.0000 mg | DELAYED_RELEASE_TABLET | Freq: Two times a day (BID) | ORAL | 0 refills | Status: DC

## 2017-05-18 MED ORDER — CYCLOBENZAPRINE HCL 10 MG PO TABS: 10.0000 mg | ORAL_TABLET | Freq: Three times a day (TID) | ORAL | 0 refills | Status: DC | PRN

## 2017-05-18 MED ORDER — FLUCONAZOLE 150 MG PO TABS: 300.0000 mg | ORAL_TABLET | ORAL | 0 refills | Status: DC

## 2017-05-18 MED ORDER — GABAPENTIN 300 MG PO CAPS: 300.0000 mg | ORAL_CAPSULE | Freq: Three times a day (TID) | ORAL | 0 refills | Status: DC

## 2017-05-18 NOTE — Progress Notes (Signed)
3/7/201911:25 AM  Theodore Bishop 1985-05-06, 32 y.o. male [id number]  Chief Complaint  Patient presents with  . Pain    Having pain in the left arm since Wednesday of last wk. 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 Says he was sleeping on the hospital couch while wife was there having baby caused the pain    HPI:   Patient is a 32 y.o. male  who presents today with left upper back, shoulder pain that started after sleeping in the hospital during the birth of his son, which was a week ago. He states pain starts near shoulder blade and travels to deltoid area. He denies any pain with movement of shoulder or neck. He feels pain is sharp and tingling. He denies any LUE weakness. He has never had similar pain before.  He is also wondering about a slowly growing rash on his back, non itchy, has been prescribed several ointments without resolution. Rash started about couple months ago.   He is also requesting referral to ophtho for left eye pterygium. It has started to affect his vision, bothers him as he sees a shadow often. Would like to discuss surgery for removal.   Depression screen [facility] 2/9 05/18/2017 03/23/2017 02/03/2017  Decreased Interest 0 0 1  Down, Depressed, Hopeless 0 0 1  PHQ - 2 Score 0 0 2  Altered sleeping - - 2  Tired, decreased energy - - 0  Change in appetite - - 0  Feeling bad or failure about yourself  - - 1  Trouble concentrating - - 0  Moving slowly or fidgety/restless - - 0  Suicidal thoughts - - 0  PHQ-9 Score - - 5  Difficult doing work/chores - - Not difficult at all    No Known Allergies  Prior to Admission medications   Medication Sig Start Date End Date Taking? Authorizing Provider  ibuprofen (ADVIL,MOTRIN) 800 MG tablet Take 800 mg by mouth every 8 (eight) hours as needed.   Yes [provider]  azelastine (ASTELIN) 0.1 % nasal spray Place 1 spray into both nostrils 2 (two) times daily. Use in each nostril as  directed Patient not taking: Reported on 05/18/2017 03/23/17   Myles Lipps, MD  benzonatate (TESSALON) 100 MG capsule Take 1 capsule (100 mg total) by mouth 2 (two) times daily as needed for cough. Patient not taking: Reported on 05/18/2017 03/23/17   Myles Lipps, MD  dextromethorphan-guaiFENesin [facility] DM) 30-600 MG 12hr tablet Take 1 tablet by mouth 2 (two) times daily.    [provider]    Past Medical History:  Diagnosis Date  . Depression   . Sinusitis     Past Surgical History:  Procedure Laterality Date  . HERNIA REPAIR    . NOSE SURGERY N/A 2013  . RHINOPLASTY      Social History   Tobacco Use  . Smoking status: Former Smoker    Packs/day: 0.00    Years: 9.00    Pack years: 0.00    Types: Cigarettes  . Smokeless tobacco: Never Used  . Tobacco comment: smokes 1 cigarette per week  Substance Use Topics  . Alcohol use: No    Comment: quit x 1 month - previously drank 12pk every weekend    Family History  Problem Relation Age of Onset  . Cancer Mother     Review of Systems  Constitutional: Negative for chills and fever.  Eyes: Positive  for blurred vision. Negative for double vision, photophobia and pain.  Musculoskeletal: Positive for back pain. Negative for neck pain.  Skin: Positive for rash. Negative for itching.  Neurological: Positive for tingling. Negative for focal weakness.     OBJECTIVE:  Blood pressure 120/72, pulse 76, temperature 98.8 F (37.1 C), temperature source Oral, height 5' 9.69" (1.77 m), weight 152 lb (68.9 kg), SpO2 97 %.  Physical Exam  Constitutional: He is oriented to person, place, and time and well-developed, well-nourished, and in no distress.  HENT:  Head: Normocephalic and atraumatic.  Mouth/Throat: Oropharynx is clear and moist.  Eyes: EOM are normal. Pupils are equal, round, and reactive to light.  Pterygium of left eye mildly inflammed  Neck: Normal range of motion. No spinous process tenderness and no  muscular tenderness present.  Cardiovascular: Normal rate and regular rhythm. Exam reveals no gallop and no friction rub.  No murmur heard. Pulmonary/Chest: Effort normal and breath sounds normal. He has no wheezes. He has no rales.  Musculoskeletal:       Left shoulder: He exhibits normal range of motion, no tenderness and no bony tenderness.       Thoracic back: He exhibits tenderness (left trapezius) and spasm. He exhibits no bony tenderness.  Neurological: He is alert and oriented to person, place, and time. He has normal strength and normal reflexes. Gait normal.  Skin: Skin is warm and dry. Rash (large hyperpigmented patches along back with smaller patches along axilla and upper chest) noted.    ASSESSMENT and PLAN  1. Acute left-sided thoracic back pain Discussed supportive measures, new meds r/se/b and RTC precautions. Patient educational handout given.  2. Tinea versicolor Given significant amount of affected area will treat with oral antifungals. If does not resolve then will refer to derm. Patient instructed to call if referral was needed.  3. Pterygium of left eye - Ambulatory referral to Ophthalmology  Other orders - ibuprofen (ADVIL,MOTRIN) 800 MG tablet; Take 800 mg by mouth every 8 (eight) hours as needed. - diclofenac (VOLTAREN) 75 MG EC tablet; Take 1 tablet (75 mg total) by mouth 2 (two) times daily. - gabapentin (NEURONTIN) 300 MG capsule; Take 1 capsule (300 mg total) by mouth 3 (three) times daily. As needed for numbness and tingling - cyclobenzaprine (FLEXERIL) 10 MG tablet; Take 1 tablet (10 mg total) by mouth 3 (three) times daily as needed for muscle spasms. - fluconazole (DIFLUCAN) 150 MG tablet; Take 2 tablets (300 mg total) by mouth once a week.  Return if symptoms worsen or fail to improve.    Myles LippsIrma M Santiago, MD Primary Care at [facility] [address] [place], [place] [address] Ph.  [phone] Fax [phone]

## 2017-05-18 NOTE — Patient Instructions (Signed)
     IF you received an x-ray today, you will receive an invoice from [facility]. Please contact [facility] at [phone] with questions or concerns regarding your invoice.   IF you received labwork today, you will receive an invoice from [facility]. Please contact [facility] at [phone] with questions or concerns regarding your invoice.   Our billing staff will not be able to assist you with questions regarding bills from these companies.  You will be contacted with the lab results as soon as they are available. The fastest way to get your results is to activate your My Chart account. Instructions are located on the last page of this paperwork. If you have not heard from us regarding the results in 2 weeks, please contact this office.     

## 2017-05-24 ENCOUNTER — Ambulatory Visit: Payer: BLUE CROSS/BLUE SHIELD | Admitting: Family Medicine

## 2017-06-06 ENCOUNTER — Ambulatory Visit: Payer: BLUE CROSS/BLUE SHIELD | Admitting: Urgent Care

## 2017-06-06 ENCOUNTER — Encounter: Payer: Self-pay | Admitting: Urgent Care

## 2017-06-06 VITALS — BP 113/78 | HR 59 | Temp 97.9°F | Resp 18 | Ht 69.0 in | Wt 154.4 lb

## 2017-06-06 DIAGNOSIS — Z789 Other specified health status: Secondary | ICD-10-CM | POA: Diagnosis not present

## 2017-06-06 DIAGNOSIS — R112 Nausea with vomiting, unspecified: Secondary | ICD-10-CM

## 2017-06-06 DIAGNOSIS — R1013 Epigastric pain: Secondary | ICD-10-CM

## 2017-06-06 DIAGNOSIS — Z7289 Other problems related to lifestyle: Secondary | ICD-10-CM

## 2017-06-06 LAB — POCT URINALYSIS DIP (MANUAL ENTRY)
BILIRUBIN UA: NEGATIVE
Glucose, UA: NEGATIVE mg/dL
Ketones, POC UA: NEGATIVE mg/dL
Leukocytes, UA: NEGATIVE
NITRITE UA: NEGATIVE
PH UA: 7.5 (ref 5.0–8.0)
Protein Ur, POC: NEGATIVE mg/dL
Spec Grav, UA: 1.015 (ref 1.010–1.025)
UROBILINOGEN UA: 0.2 U/dL

## 2017-06-06 MED ORDER — ONDANSETRON 4 MG PO TBDP
8.0000 mg | ORAL_TABLET | Freq: Once | ORAL | Status: AC
  Administered 2017-06-06: 8 mg via ORAL

## 2017-06-06 MED ORDER — RANITIDINE HCL 150 MG PO TABS: 150.0000 mg | ORAL_TABLET | Freq: Two times a day (BID) | ORAL | 0 refills | Status: DC

## 2017-06-06 MED ORDER — ONDANSETRON 8 MG PO TBDP: 8.0000 mg | ORAL_TABLET | Freq: Three times a day (TID) | ORAL | 0 refills | Status: DC | PRN

## 2017-06-06 NOTE — Patient Instructions (Addendum)
Gastritis en los adultos (Gastritis, Adult) La gastritis es la irritacin del estmago. Hay dos tipos de gastritis:  Gastritis aguda. Este tipo aparece de manera repentina.  Gastritis crnica. Este tipo dura [place]. La gastritis aparece cuando la membrana que recubre el estmago se debilita o se daa. Sin tratamiento, la gastritis puede causar hemorragias y lceras estomacales. CAUSAS Esta afeccin puede ser causada por lo siguiente:  Una infeccin.  Beber alcohol en exceso.  Ciertos medicamentos.  Tener demasiada cantidad de cido [employer].  Una enfermedad de los intestinos o del [place].  Estrs. SNTOMAS Los sntomas de esta afeccin incluyen lo siguiente:  Dolor o ardor en la parte superior del abdomen.  Nuseas.  Vmitos.  Sensacin molesta de distensin despus de comer. En algunos casos no hay sntomas. DIAGNSTICO Esta afeccin se puede diagnosticar a travs de lo siguiente:  Una descripcin de los sntomas.  Un examen fsico.  Estudios. Estos pueden incluir los siguientes: ? Anlisis de [place]. ? Pruebas de materia fecal. ? Una prueba en la que se introduce un instrumento delgado y flexible que tiene una luz y Neomia Dear cmara en la punta a travs del esfago y [place] (endoscopia superior). ? Una prueba en la que se toma una muestra de tejido para Public librarian (biopsia). TRATAMIENTO Esta afeccin puede tratarse con medicamentos. Si la afeccin es causada por una infeccin bacteriana, pueden darle antibiticos. Si es causada por demasiada cantidad de cido en el estmago, pueden darle medicamentos llamados bloqueadores H2, inhibidores de la bomba de protones o anticidos. El tratamiento tambin puede incluir la suspensin del uso de ciertos medicamentos, como la aspirina, el ibuprofeno u otros antiinflamatorios no esteroides (AINE). INSTRUCCIONES PARA EL CUIDADO EN EL HOGAR  Tome los medicamentos de venta libre y los recetados solamente como se  lo haya indicado el mdico.  Si le recetaron un antibitico, tmelo como se lo haya indicado el mdico. No deje de tomar los antibiticos aunque comience a Actor.  Beba suficiente lquido para Photographer orina clara o de color amarillo plido.  Haga varias comidas pequeas y frecuentes Freight forwarder de comidas abundantes. SOLICITE ATENCIN MDICA SI:  Los sntomas empeoran.  Los sntomas regresan despus del tratamiento. SOLICITE ATENCIN MDICA DE INMEDIATO SI:  Vomita sangre de color rojo brillante o una sustancia similar a los granos de caf.  La materia fecal es negra o de color rojo oscuro.  No puede retener los lquidos.  El dolor abdominal empeora.  Tiene fiebre.  No mejora luego de 1 semana. Esta informacin no tiene Theme park manager el consejo del mdico. Asegrese de hacerle al mdico cualquier pregunta que tenga. Document Released: 12/08/2004 Document Revised: 11/19/2014 Document Reviewed: 11/22/2014 Elsevier Interactive Patient Education  2018 [employer].     Vmitos, en adultos Vomiting, Adult Los vmitos se producen cuando el contenido del estmago se expulsa por la boca. Muchas personas sienten nuseas antes de vomitar. Los vmitos pueden hacerlo sentir dbil y causarle deshidratacin. La deshidratacin puede causarle cansancio, sed, sequedad en la boca y disminucin en la frecuencia con la que orina. Los ONEOK y las personas que tienen otras enfermedades o un sistema inmunitario dbil estn en mayor riesgo de deshidratacin. Es importante tratar los vmitos como se lo haya indicado el mdico. Siga estas indicaciones en su casa: Siga las instrucciones del mdico acerca de cmo cuidarse en el hogar. Qu debe comer y beber Siga estas recomendaciones como se lo haya indicado el mdico:  Tome una solucin de rehidratacin oral (oral rehydration solution, ORS). Esta es una bebida que se vende en farmacias y tiendas  minoristas.  En la medida en que pueda, consuma alimentos blandos y fciles de digerir en pequeas cantidades. Estos alimentos incluyen bananas, compota de [place]manzana, arroz, carnes [place] [address] Georgemagras, tostadas y [address]letas saladas.  Beba lquidos claros en pequeas cantidades segn le sea posible. Los lquidos claros incluyen agua, cubitos de hielo, Minnesotabebidas deportivas bajas en caloras y Sloveniajugo de fruta rebajado con agua (jugo de fruta diluido).  Evite beber lquidos que contengan mucha azcar o cafena.  Evite consumir alcohol y alimentos picantes o con alto contenido de Grovetongrasa.  Instrucciones generales   Lvese las manos frecuentemente con agua y [place]jabn. Use desinfectante para manos si no dispone de [place] y [place]jabn. Asegrese de que todos en el hogar se laven las manos con frecuencia.  Tome los medicamentos de venta libre y los recetados solamente como se lo haya indicado el mdico.  Controle su afeccin para Insurance risk surveyordetectar cualquier cambio.  Concurra a todas las visitas de [address] como se lo haya indicado el mdico. Esto es importante. Comunquese con un mdico si:  Tiene fiebre.  No puede retener los lquidos.  Los vmitos empeoran.  Aparecen nuevos sntomas.  Se siente mareado o siente que va a desvanecerse.  Tiene dolor de Turkmenistancabeza.  Tiene calambres musculares. Solicite ayuda de inmediato si:  L-3 CommunicationsSiente dolor en el pecho, el cuello, los brazos o la [place]al Terracemandbula.  Se siente muy dbil o se desmaya.  Tiene vmitos persistentes.  Vomita y el vmito es de color rojo intenso o se asemeja al poso del caf.  Las Automatic Dataheces tienen sangre o son de color negro, o tienen aspecto alquitranado.  Tiene dolor intenso, clicos o distensin abdominal.  Siente dolor de cabeza intenso, rigidez en el cuello, o ambas cosas.  Tiene una erupcin cutnea.  Tiene problemas para respirar o respira muy rpidamente.  Su corazn late muy rpidamente.  Siente la piel fra y hmeda.  Se siente confundido.  Siente dolor  al ConocoPhillipsorinar.  Tiene signos de deshidratacin, como los siguientes: ? Orina de color oscuro, muy escasa o no orina. ? Labios agrietados. ? M.D.C. HoldingsBoca seca. ? Ojos hundidos. ? Somnolencia. ? Debilidad. Estos sntomas pueden representar un problema grave que constituye Radio broadcast assistantuna emergencia. No espere hasta que los sntomas desaparezcan. Solicite atencin mdica de inmediato. Comunquese con el servicio de emergencias de su localidad (911 en los [place]). No conduzca por sus propios medios OfficeMax Incorporatedhasta el hospital. Esta informacin no tiene Theme park managercomo fin reemplazar el consejo del mdico. Asegrese de hacerle al mdico cualquier pregunta que tenga. Document Released: 03/27/2015 Document Revised: 06/08/2016 Document Reviewed: 11/04/2014 Elsevier Interactive Patient Education  2018 ArvinMeritorElsevier Inc.     IF you received an x-ray today, you will receive an invoice from [facility]. Please contact [facility] at [phone] with questions or concerns regarding your invoice.   IF you received labwork today, you will receive an invoice from [facility]. Please contact [facility] at [phone] with questions or concerns regarding your invoice.   Our billing staff will not be able to assist you with questions regarding bills from these companies.  You will be contacted with the lab results as soon as they are available. The fastest way to get your results is to activate your My Chart account. Instructions are located on the last page of this paperwork. If you have not heard from us regarding the results in 2 weeks, please contact this office.

## 2017-06-06 NOTE — Progress Notes (Signed)
    MRN: [id number] DOB: 04/12/1985  Subjective:   Theodore Bishop is a 32 y.o. male presenting for 3 day history of nausea with vomiting, had multiple episodes of vomiting, epigastric pain, dry mouth, headaches. Had a 12 pack of beer on Saturday, had not drank in a long time. Has tried Alka-seltzer with minimal relief. Denies fever, chest pain, diarrhea, hematuria, tremor. Denies smoking cigarettes. Hydrates with 6 bottles of water. Works as a Psychologist, occupationalwelder.   Theodore Bishop has a current medication list which includes the following prescription(s): gabapentin. Also has No Known Allergies.  Theodore Bishop  has a past medical history of Depression and Sinusitis. Also  has a past surgical history that includes Hernia repair; Rhinoplasty; and Nose surgery (N/A, 2013).  Objective:   Vitals: BP 113/78   Pulse (!) 59   Temp 97.9 F (36.6 C) (Oral)   Resp 18   Ht 5\' 9"  (1.753 m)   Wt 154 lb 6.4 oz (70 kg)   SpO2 97%   BMI 22.80 kg/m   Physical Exam  Constitutional: He is oriented to person, place, and time. He appears well-developed and well-nourished.  HENT:  Mouth/Throat: Oropharynx is clear and moist.  Cardiovascular: Normal rate, regular rhythm and intact distal pulses. Exam reveals no gallop and no friction rub.  No murmur heard. Pulmonary/Chest: No respiratory distress. He has no wheezes. He has no rales.  Abdominal: Soft. Bowel sounds are normal. He exhibits no distension and no mass. There is tenderness (mild, generalized, worst over epigastric region). There is no rebound and no guarding.  Neurological: He is alert and oriented to person, place, and time.  Skin: Skin is warm and dry.  Psychiatric: He has a normal mood and affect.   Results for orders placed or performed in visit on 06/06/17 (from the past 24 hour(s))  POCT urinalysis dipstick     Status: Abnormal   Collection Time: 06/06/17 11:54 AM  Result Value Ref Range   Color, UA yellow yellow   Clarity, UA clear clear   Glucose, UA  negative negative mg/dL   Bilirubin, UA negative negative   Ketones, POC UA negative negative mg/dL   Spec Grav, UA 4.0981.015 1.1911.010 - 1.025   Blood, UA trace-lysed (A) negative   pH, UA 7.5 5.0 - 8.0   Protein Ur, POC negative negative mg/dL   Urobilinogen, UA 0.2 0.2 or 1.0 E.U./dL   Nitrite, UA Negative Negative   Leukocytes, UA Negative Negative    Assessment and Plan :   Abdominal pain, epigastric - Plan: Comprehensive metabolic panel, H. pylori breath test  Nausea and vomiting, intractability of vomiting not specified, unspecified vomiting type - Plan: POCT urinalysis dipstick, ondansetron (ZOFRAN-ODT) disintegrating tablet 8 mg  Alcohol use  Counseled on gastritis likely due to heavy alcohol use. Recommended supportive care, abstaining from alcohol. Return-to-clinic precautions discussed, patient verbalized understanding.   Theodore BambergMario Refujio Haymer, PA-C Primary Care at [facility] [phone] 06/06/2017  11:29 AM

## 2017-06-07 LAB — COMPREHENSIVE METABOLIC PANEL
A/G RATIO: 2.2 (ref 1.2–2.2)
ALT: 71 IU/L — AB (ref 0–44)
AST: 30 IU/L (ref 0–40)
Albumin: 5.3 g/dL (ref 3.5–5.5)
Alkaline Phosphatase: 76 IU/L (ref 39–117)
BILIRUBIN TOTAL: 0.6 mg/dL (ref 0.0–1.2)
BUN/Creatinine Ratio: 15 (ref 9–20)
BUN: 11 mg/dL (ref 6–20)
CHLORIDE: 101 mmol/L (ref 96–106)
CO2: 24 mmol/L (ref 20–29)
Calcium: 10.1 mg/dL (ref 8.7–10.2)
Creatinine, Ser: 0.74 mg/dL — ABNORMAL LOW (ref 0.76–1.27)
GFR calc non Af Amer: 124 mL/min/{1.73_m2} (ref >59)
GFR, EST AFRICAN AMERICAN: 143 mL/min/{1.73_m2} (ref >59)
Globulin, Total: 2.4 g/dL (ref 1.5–4.5)
Glucose: 101 mg/dL — ABNORMAL HIGH (ref 65–99)
POTASSIUM: 4.2 mmol/L (ref 3.5–5.2)
Sodium: 142 mmol/L (ref 134–144)
Total Protein: 7.7 g/dL (ref 6.0–8.5)

## 2017-06-07 LAB — H. PYLORI BREATH TEST: H PYLORI BREATH TEST: POSITIVE — AB

## 2017-06-08 ENCOUNTER — Other Ambulatory Visit: Payer: Self-pay | Admitting: Urgent Care

## 2017-06-08 MED ORDER — CLARITHROMYCIN 500 MG PO TABS: 500.0000 mg | ORAL_TABLET | Freq: Two times a day (BID) | ORAL | 0 refills | Status: DC

## 2017-06-08 MED ORDER — AMOXICILLIN 500 MG PO CAPS: 1000.0000 mg | ORAL_CAPSULE | Freq: Two times a day (BID) | ORAL | 0 refills | Status: DC

## 2017-06-08 MED ORDER — OMEPRAZOLE 20 MG PO CPDR: 20.0000 mg | DELAYED_RELEASE_CAPSULE | Freq: Two times a day (BID) | ORAL | 0 refills | Status: DC

## 2017-06-12 ENCOUNTER — Telehealth: Payer: Self-pay

## 2017-06-12 NOTE — Telephone Encounter (Signed)
Phone call to patient with spanish interpreter Tobi Bastosnna [phone].   Patient states, "I'm taking three medications now but this medication (clarithromycin) is causing my throat, it is difficult for me to pass. Also I feel my tongue as if I had blisters or hives on tongue"  Patient asked if any problems swallowing. He states, "Not too much problems" swallowing, denies swelling of lips, tongue, and throat. States that after taking clarithromycin yesterday he had "A little bit when that happened I just wanted to breathe through my mouth". Diarrhea yesterday, today is improving. Problems sleeping since he started medication, was able to sleep last night.   Problems swallowing and breathing have improved after missing dose this morning. Last time clarithromycin taken was yesterday in afternoon. Still taking Amoxicillin and omeprazole. Azithromycin taken this morning, omeprazole taking twice a day.  Patient states he took excedrin Saturday, yesterday. Denies symptoms with taking this medicine.   Preferred pharmacy is [facility] [address]   Due to difficulties swallowing and breathing, patient instructed to hold clarithromycin for now. He verbalizes understanding.   Provider, please advise regarding clarithromycin. Do you want him to start new regimen of medications?

## 2017-06-12 NOTE — Telephone Encounter (Signed)
Copied from CRM [id number]. Topic: General - Other >> Jun 12, 2017  9:49 AM Percival SpanishKennedy, Cheryl W wrote:  Pt call to say he is a weird action when taking the below med and would like a call back    Clarithromycin  [phone]   pt would like a call back

## 2017-06-15 NOTE — Telephone Encounter (Signed)
Patient reports that he is doing better with clarithromycin and has continued to take his antibiotics without any recurrence of his symptoms. He plans on finishing the course and will rtc as needed. Plans on having eye surgery that his surgeon wants him to rtc for a recheck with us.

## 2017-07-17 ENCOUNTER — Telehealth: Payer: Self-pay | Admitting: Urgent Care

## 2017-07-17 NOTE — Telephone Encounter (Signed)
Please advise 

## 2017-07-17 NOTE — Telephone Encounter (Signed)
Copied from CRM [id number]. Topic: Quick Communication - See Telephone Encounter >> Jul 17, 2017 11:37 AM Theodore Bishop, Rosey Bath D wrote: CRM for notification. See Telephone encounter for: 07/17/17. Patient called and would like to let Wallis Bamberg that he finished his medication and has some diarrhea. He has an appt later this month to schedule his surgery and would like to know if he needs to come in so he can check him to clear him for eye surgery. Please call patient back, thanks.

## 2017-07-20 NOTE — Telephone Encounter (Signed)
Patient reports that had his belly pain is completely resolved.  He does admit that he had some upset stomach and loose stools while he was taking his antibiotic course.  His primary concern is whether or not he can proceed with his eye surgery.  I counseled that he can do so, he is been asymptomatic for the past couple weeks.  I advised that we can consider him treated and he can return to clinic as needed.

## 2017-08-03 ENCOUNTER — Emergency Department (HOSPITAL_COMMUNITY)
Admission: EM | Admit: 2017-08-03 | Discharge: 2017-08-04 | Disposition: A | Discharge status: Home / Self Care | Payer: BLUE CROSS/BLUE SHIELD | Attending: Emergency Medicine | Admitting: Emergency Medicine

## 2017-08-03 ENCOUNTER — Encounter (HOSPITAL_COMMUNITY): Payer: Self-pay | Admitting: Emergency Medicine

## 2017-08-03 ENCOUNTER — Ambulatory Visit: Payer: Self-pay | Admitting: *Deleted

## 2017-08-03 DIAGNOSIS — F4321 Adjustment disorder with depressed mood: Secondary | ICD-10-CM | POA: Diagnosis not present

## 2017-08-03 DIAGNOSIS — Z87891 Personal history of nicotine dependence: Secondary | ICD-10-CM | POA: Insufficient documentation

## 2017-08-03 DIAGNOSIS — G479 Sleep disorder, unspecified: Secondary | ICD-10-CM

## 2017-08-03 DIAGNOSIS — F320 Major depressive disorder, single episode, mild: Secondary | ICD-10-CM | POA: Diagnosis not present

## 2017-08-03 DIAGNOSIS — Z6379 Other stressful life events affecting family and household: Secondary | ICD-10-CM | POA: Diagnosis not present

## 2017-08-03 LAB — CBC
HCT: 41.4 % (ref 39.0–52.0)
Hemoglobin: 14 g/dL (ref 13.0–17.0)
MCH: 28.2 pg (ref 26.0–34.0)
MCHC: 33.8 g/dL (ref 30.0–36.0)
MCV: 83.5 fL (ref 78.0–100.0)
Platelets: 247 10*3/uL (ref 150–400)
RBC: 4.96 MIL/uL (ref 4.22–5.81)
RDW: 12.7 % (ref 11.5–15.5)
WBC: 6 10*3/uL (ref 4.0–10.5)

## 2017-08-03 LAB — RAPID URINE DRUG SCREEN, HOSP PERFORMED
AMPHETAMINES: NOT DETECTED
BENZODIAZEPINES: NOT DETECTED
Barbiturates: NOT DETECTED
Cocaine: NOT DETECTED
Opiates: NOT DETECTED
Tetrahydrocannabinol: NOT DETECTED

## 2017-08-03 LAB — COMPREHENSIVE METABOLIC PANEL
ALT: 35 U/L (ref 17–63)
AST: 25 U/L (ref 15–41)
Albumin: 4.9 g/dL (ref 3.5–5.0)
Alkaline Phosphatase: 55 U/L (ref 38–126)
Anion gap: 12 (ref 5–15)
BUN: 9 mg/dL (ref 6–20)
CO2: 23 mmol/L (ref 22–32)
Calcium: 9.5 mg/dL (ref 8.9–10.3)
Chloride: 104 mmol/L (ref 101–111)
Creatinine, Ser: 0.89 mg/dL (ref 0.61–1.24)
GFR calc Af Amer: >60 mL/min (ref >60)
GFR calc non Af Amer: >60 mL/min (ref >60)
Glucose, Bld: 114 mg/dL — ABNORMAL HIGH (ref 65–99)
Potassium: 3.7 mmol/L (ref 3.5–5.1)
Sodium: 139 mmol/L (ref 135–145)
Total Bilirubin: 1.1 mg/dL (ref 0.3–1.2)
Total Protein: 7.6 g/dL (ref 6.5–8.1)

## 2017-08-03 LAB — ETHANOL: Alcohol, Ethyl (B): <10 mg/dL (ref <10)

## 2017-08-03 LAB — ACETAMINOPHEN LEVEL: Acetaminophen (Tylenol), Serum: <10 ug/mL — ABNORMAL LOW (ref 10–30)

## 2017-08-03 LAB — SALICYLATE LEVEL: Salicylate Lvl: <7 mg/dL (ref 2.8–30.0)

## 2017-08-03 MED ORDER — ZOLPIDEM TARTRATE 5 MG PO TABS
5.0000 mg | ORAL_TABLET | Freq: Every day | ORAL | Status: DC
  Administered 2017-08-03: 5 mg via ORAL
  Filled 2017-08-03: qty 1

## 2017-08-03 MED ORDER — FLUOXETINE HCL 20 MG PO CAPS
20.0000 mg | ORAL_CAPSULE | Freq: Every day | ORAL | Status: DC
  Administered 2017-08-03 – 2017-08-04 (×2): 20 mg via ORAL
  Filled 2017-08-03 (×2): qty 1

## 2017-08-03 MED ORDER — ZOLPIDEM TARTRATE 5 MG PO TABS: 5.0000 mg | ORAL_TABLET | Freq: Every evening | ORAL | Status: DC | PRN

## 2017-08-03 NOTE — ED Notes (Signed)
Bed: ZOX09 Expected date:  Expected time:  Means of arrival:  Comments: 30

## 2017-08-03 NOTE — ED Triage Notes (Signed)
Patient here voluntary with suicidal ideation and occasional feelings that he wants to shoot himself and his family with a gun.  Patient calm and cooperative, says he has not been sleeping well and has never received any services from a mental health care provider before.  Patient given purple scrubs and is changing into those.  Patient denies any drug use and AV hallucinations.

## 2017-08-03 NOTE — ED Provider Notes (Signed)
[facility]-EMERGENCY DEPT Provider Note   CSN: [id number] Arrival date & time: 08/03/17  1147     History   Chief Complaint Chief Complaint  Patient presents with  . Suicidal    HPI Theodore Bishop is a 32 y.o. male here for evaluation of difficulty sleeping since Friday. States he tries to stay busy so he will be tired but cannot stop his thoughts at night. Associated with increased depressed mood.  He has a three month old daughter and states looking at her makes him want to cry. Reports two months ago he had sudden thought of killing himself and his whole family with a gun, this lasted one day and it has not reoccurred.  No current SI, HI, AVH. Denies illicit drug use. No ETOH use.    HPI  Past Medical History:  Diagnosis Date  . Depression   . Sinusitis     There are no active problems to display for this patient.   Past Surgical History:  Procedure Laterality Date  . HERNIA REPAIR    . NOSE SURGERY N/A 2013  . RHINOPLASTY          Home Medications    Prior to Admission medications   Medication Sig Start Date End Date Taking? Authorizing Provider  gabapentin (NEURONTIN) 300 MG capsule Take 1 capsule (300 mg total) by mouth 3 (three) times daily. As needed for numbness and tingling 05/18/17  Yes Myles Lipps, MD  omeprazole (PRILOSEC) 20 MG capsule Take 1 capsule (20 mg total) by mouth 2 (two) times daily before a meal. 06/08/17  Yes Wallis Bamberg, PA-C  ranitidine (ZANTAC) 150 MG tablet Take 1 tablet (150 mg total) by mouth 2 (two) times daily. 06/06/17  Yes Wallis Bamberg, PA-C  amoxicillin (AMOXIL) 500 MG capsule Take 2 capsules (1,000 mg total) by mouth 2 (two) times daily. Patient not taking: Reported on 08/03/2017 06/08/17   Wallis Bamberg, PA-C  clarithromycin (BIAXIN) 500 MG tablet Take 1 tablet (500 mg total) by mouth 2 (two) times daily. Patient not taking: Reported on 08/03/2017 06/08/17   Wallis Bamberg, PA-C  ondansetron (ZOFRAN-ODT) 8 MG  disintegrating tablet Take 1 tablet (8 mg total) by mouth every 8 (eight) hours as needed for nausea or vomiting. Patient not taking: Reported on 08/03/2017 06/06/17   Wallis Bamberg, PA-C    Family History Family History  Problem Relation Age of Onset  . Cancer Mother     Social History Social History   Tobacco Use  . Smoking status: Former Smoker    Packs/day: 0.00    Years: 9.00    Pack years: 0.00    Types: Cigarettes  . Smokeless tobacco: Never Used  . Tobacco comment: smokes 1 cigarette per week  Substance Use Topics  . Alcohol use: No    Comment: quit x 1 month - previously drank 12pk every weekend  . Drug use: No     Allergies   Patient has no known allergies.   Review of Systems Review of Systems  Psychiatric/Behavioral: Positive for dysphoric mood and sleep disturbance.  All other systems reviewed and are negative.    Physical Exam Updated Vital Signs BP (!) 141/90 (BP Location: Right Arm)   Pulse 74   Temp 98.6 F (37 C) (Oral)   Resp 18   SpO2 99%   Physical Exam  Constitutional: He is oriented to person, place, and time. He appears well-developed and well-nourished. No distress.  NAD.  HENT:  Head: Normocephalic  and atraumatic.  Right Ear: External ear normal.  Left Ear: External ear normal.  Nose: Nose normal.  Eyes: Conjunctivae and EOM are normal. No scleral icterus.  Neck: Normal range of motion. Neck supple.  Cardiovascular: Normal rate, regular rhythm, normal heart sounds and intact distal pulses.  No murmur heard. Pulmonary/Chest: Effort normal and breath sounds normal. He has no wheezes.  Musculoskeletal: Normal range of motion. He exhibits no deformity.  Neurological: He is alert and oriented to person, place, and time.  Skin: Skin is warm and dry. Capillary refill takes less than 2 seconds.  Psychiatric:  Pleasant. Cooperative. Denies SI, HI, AVH  Nursing note and vitals reviewed.    ED Treatments / Results  Labs (all labs  ordered are listed, but only abnormal results are displayed) Labs Reviewed  COMPREHENSIVE METABOLIC PANEL - Abnormal; Notable for the following components:      Result Value   Glucose, Bld 114 (*)    All other components within normal limits  ACETAMINOPHEN LEVEL - Abnormal; Notable for the following components:   Acetaminophen (Tylenol), Serum <10 (*)    All other components within normal limits  ETHANOL  SALICYLATE LEVEL  CBC  RAPID URINE DRUG SCREEN, HOSP PERFORMED    EKG None  Radiology No results found.  Procedures Procedures (including critical care time)  Medications Ordered in ED Medications  FLUoxetine (PROZAC) capsule 20 mg (20 mg Oral Given 08/03/17 1601)  zolpidem (AMBIEN) tablet 5 mg (5 mg Oral Given 08/03/17 2116)     Initial Impression / Assessment and Plan / ED Course  I have reviewed the triage vital signs and the nursing notes.  Pertinent labs & imaging results that were available during my care of the patient were reviewed by me and considered in my medical decision making (see chart for details).    Pt here with worsening insomnia.  One day of suicidal ideation with plan several weeks ago, none since. No previous psychiatric history . Known stressors at home. No illicit drug or ETOH abuse. Exam is unremarkable. Has been cooperative in ED. Labs reviewed and WNL. He is medically cleared for psychiatric evaluation. Sitter at bedside. Home medications ordered. Discussed ED course and upcoming psych evaluation with patient who is agreeable to stay voluntarily.    Final Clinical Impressions(s) / ED Diagnoses   Final diagnoses:  None    ED Discharge Orders    None       Jerrell Mylar 08/03/17 2137    Rolan Bucco, MD 08/04/17 [phone]

## 2017-08-03 NOTE — Telephone Encounter (Signed)
No current   Suicidal thoughts. No availability at [facility]  Pt advised to go to [facility] address given   Reason for Disposition . [1] Depression AND [2] unable to do any of normal activities (e.g., self care, school, work; in comparison to baseline).  Answer Assessment - Initial Assessment Questions 1. CONCERN: "What happened that made you call today?"      Depression  -  Insomnia   2. DEPRESSION SYMPTOM SCREENING: "How are you feeling overall?" (e.g., decreased energy, increased sleeping or difficulty sleeping, difficulty concentrating, feelings of sadness, guilt, hopelessness, or worthlessness)       Working -  Decreased energy  Insomnia   Sad   3. RISK OF HARM - SUICIDAL IDEATION:  "Do you ever have thoughts of hurting or killing yourself?"  (e.g., yes, no, no but preoccupation with thoughts about death)   - INTENT:  "Do you have thoughts of hurting or killing yourself right NOW?" (e.g., yes, no, N/A)   - PLAN: "Do you have a specific plan for how you would do this?" (e.g., gun, knife, overdose, no plan, N/A)       Has  Thoughts of hurting  Himself  None now    4. RISK OF HARM - HOMICIDAL IDEATION:  "Do you ever have thoughts of hurting or killing someone else?"  (e.g., yes, no, no but preoccupation with thoughts about death)   - INTENT:  "Do you have thoughts of hurting or killing someone right NOW?" (e.g., yes, no, N/A)   - PLAN: "Do you have a specific plan for how you would do this?" (e.g., gun, knife, no plan, N/A)      No     5. FUNCTIONAL IMPAIRMENT: "How have things been going for you overall in your life? Have you had any more difficulties than usual doing your normal daily activities?"  (e.g., better, same, worse; self-care, school, work, interactions)      Still  Working   Family  Stressors   6. SUPPORT: "Who is with you now?" "Who do you live with?" "Do you have family or friends nearby who you can talk to?"        At work  Now   7. THERAPIST: "Do you have a counselor  or therapist? Name?"        None   8. STRESSORS: "Has there been any new stress or recent changes in your life?"      Family stressors  Worry about things   9. DRUG ABUSE/ALCOHOL: "Do you drink alcohol or use any illegal drugs?"       None  10. OTHER: "Do you have any other health or medical symptoms right now?" (e.g., fever)       None  11. PREGNANCY: "Is there any chance you are pregnant?" "When was your last menstrual period?"        n/a  Protocols used: DEPRESSION-A-AH

## 2017-08-03 NOTE — ED Notes (Signed)
Pt to room #39. Pt behavior cooperative, pleasant on approach. Sad affect, pt endorsing depression. Endorsing passive SI without plan. Pt reports he has been having difficulty sleeping. Encouragement and support provided. Special checks q 15 mins in place for safety, Video monitoring in place. Will continue to monitor.

## 2017-08-03 NOTE — ED Notes (Signed)
Pt talking on hallway phone.  

## 2017-08-03 NOTE — BH Assessment (Signed)
Tele Assessment Note   Patient Name: Theodore Bishop MRN: [id number] Referring Physician: Cherly Beach, DO Location of Patient: [facility] Location of Provider: Behavioral Health TTS Department  [facility] Janssen is an 32 y.o. male present to [facility] with worsening depressive symptoms, unspecified onset. Symptoms include uncontrollable crying spills, insomnia, decreased sleep and decreased appetite. Report has 38 month old daughter and depressive symptoms is affecting how he cares for her. When baby cries he cries. Denies current suicidal ideations. Last suicidal ideations two months ago without a plan. Denies homicidal ideation and auditory / visual hallucinations. Denies history of mental health. No family history of suicide or mental health. Denies criminal involvement or upcoming court appearance.    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 Depression   . Sinusitis     Past Surgical History:  Procedure Laterality Date  . HERNIA REPAIR    . NOSE SURGERY N/A 2013  . RHINOPLASTY      Family History:  Family History  Problem Relation Age of Onset  . Cancer Mother     Social History:  reports that he has quit smoking. His smoking use included cigarettes. He smoked 0.00 packs per day for 9.00 years. He has never used smokeless tobacco. He reports that he does not drink alcohol or use drugs.  Additional Social History:  Alcohol / Drug Use Pain Medications: see MAR Prescriptions: see MAR Over the Counter: see MAR History of alcohol / drug use?: No history of alcohol / drug abuse  CIWA: CIWA-Ar BP: (!) 147/93 Pulse Rate: 69 COWS:    Allergies: No Known Allergies  Home Medications:  (Not in a hospital admission)  OB/GYN Status:  No LMP for male patient.  General Assessment Data Location of Assessment: [facility] TTS Assessment: In system Is this a Tele or Face-to-Face  Assessment?: Face-to-Face Is this an Initial Assessment or a Re-assessment for this encounter?: Initial Assessment Marital status: Married Living Arrangements: Alone Can pt return to current living arrangement?: Yes Admission Status: Voluntary Is patient capable of signing voluntary admission?: Yes Referral Source: Self/Family/Friend Insurance type: self-pay     Crisis Care Plan Living Arrangements: Alone Legal Guardian: Other:(self) Name of Psychiatrist: pt denies  Name of Therapist: pt denies  Education Status Is patient currently in school?: No Is the patient employed, unemployed or receiving disability?: Employed  Risk to self with the past 6 months Suicidal Ideation: No-Not Currently/Within Last 6 Months(pt report last suicidal thought 2 months ) Has patient been a risk to self within the past 6 months prior to admission? : No Suicidal Intent: No Has patient had any suicidal intent within the past 6 months prior to admission? : No Is patient at risk for suicide?: Yes Suicidal Plan?: No Has patient had any suicidal plan within the past 6 months prior to admission? : No Access to Means: No What has been your use of drugs/alcohol within the last 12 months?: pt denies  Previous Attempts/Gestures: No How many times?: 0 Other Self Harm Risks: pt denies Triggers for Past Attempts: None known Intentional Self Injurious Behavior: None Family Suicide History: No Recent stressful life event(s): Other (Comment)(birth of daughter (4 months old) ) Persecutory voices/beliefs?: No Depression: Yes Depression Symptoms: Despondent, Insomnia, Tearfulness, Loss of interest in usual pleasures, Feeling worthless/self pity Substance abuse history and/or treatment for substance abuse?: No Suicide prevention information given to non-admitted patients: Not applicable  Risk to  Others within the past 6 months Homicidal Ideation: No Does patient have any lifetime risk of violence toward others  beyond the six months prior to admission? : No Thoughts of Harm to Others: No Current Homicidal Intent: No Current Homicidal Plan: No Access to Homicidal Means: No Identified Victim: n/a History of harm to others?: No Assessment of Violence: None Noted Violent Behavior Description: none noted Does patient have access to weapons?: No Criminal Charges Pending?: No Does patient have a court date: No Is patient on probation?: No  Psychosis Hallucinations: None noted Delusions: None noted  Mental Status Report Appearance/Hygiene: In scrubs Eye Contact: Good Motor Activity: Freedom of movement Speech: Logical/coherent Level of Consciousness: Alert Mood: Depressed Affect: Depressed Anxiety Level: None Thought Processes: Coherent, Relevant Judgement: Unimpaired Orientation: Person, Place, Time, Situation Obsessive Compulsive Thoughts/Behaviors: None  Cognitive Functioning Concentration: Normal Memory: Recent Intact, Remote Intact Is patient IDD: No Is patient DD?: No Insight: Good Impulse Control: Good Appetite: Poor(pt report decreased appetite) Have you had any weight changes? : No Change Sleep: Decreased Total Hours of Sleep: (report decreased sleep, total hours unknown, varies ) Vegetative Symptoms: None  ADLScreening [facility] Assessment Services) Patient's cognitive ability adequate to safely complete daily activities?: Yes Patient able to express need for assistance with ADLs?: Yes Independently performs ADLs?: Yes (appropriate for developmental age)  Prior Inpatient Therapy Prior Inpatient Therapy: No  Prior Outpatient Therapy Prior Outpatient Therapy: No Does patient have an ACCT team?: No Does patient have Intensive In-House Services?  : No Does patient have [facility] services? : No Does patient have P4CC services?: No  ADL Screening (condition at time of admission) Patient's cognitive ability adequate to safely complete daily activities?: Yes Is the patient  deaf or have difficulty hearing?: No Does the patient have difficulty seeing, even when wearing glasses/contacts?: No Does the patient have difficulty concentrating, remembering, or making decisions?: No Patient able to express need for assistance with ADLs?: Yes Does the patient have difficulty dressing or bathing?: No Independently performs ADLs?: Yes (appropriate for developmental age) Does the patient have difficulty walking or climbing stairs?: No       Abuse/Neglect Assessment (Assessment to be complete while patient is alone) Abuse/Neglect Assessment Can Be Completed: Yes Physical Abuse: Denies Verbal Abuse: Denies Sexual Abuse: Denies Exploitation of patient/patient's resources: Denies Self-Neglect: Denies     Merchant [employer] officer (For Healthcare) Does Patient Have a Medical Advance Directive?: No Would patient like information on creating a medical advance directive?: No - Patient declined          Disposition:  Disposition Initial Assessment Completed for this Encounter: Yes(Jamison Lord, NP, recommend overnight observation )   Chere Babson [facility] 08/03/2017 3:35 PM

## 2017-08-03 NOTE — ED Notes (Signed)
TTS at bedside. 

## 2017-08-04 DIAGNOSIS — Z6379 Other stressful life events affecting family and household: Secondary | ICD-10-CM | POA: Diagnosis not present

## 2017-08-04 DIAGNOSIS — F320 Major depressive disorder, single episode, mild: Secondary | ICD-10-CM

## 2017-08-04 DIAGNOSIS — Z87891 Personal history of nicotine dependence: Secondary | ICD-10-CM

## 2017-08-04 DIAGNOSIS — F4321 Adjustment disorder with depressed mood: Secondary | ICD-10-CM | POA: Diagnosis present

## 2017-08-04 MED ORDER — TRAZODONE HCL 100 MG PO TABS: 100.0000 mg | ORAL_TABLET | Freq: Every evening | ORAL | 1 refills | Status: DC | PRN

## 2017-08-04 MED ORDER — TRAZODONE HCL 100 MG PO TABS: 100.0000 mg | ORAL_TABLET | Freq: Every evening | ORAL | Status: DC | PRN

## 2017-08-04 MED ORDER — FLUOXETINE HCL 20 MG PO CAPS
20.0000 mg | ORAL_CAPSULE | Freq: Every day | ORAL | 1 refills | Status: DC
Start: 2017-08-04 — End: 2021-06-08

## 2017-08-04 MED ORDER — TRAZODONE HCL 100 MG PO TABS: 100.0000 mg | ORAL_TABLET | Freq: Every day | ORAL | Status: DC

## 2017-08-04 NOTE — Consult Note (Addendum)
[facility] Consult   Reason for Consult:  Depression and lack of sleep Referring Physician:  EDP Patient Identification: Theodore Bishop MRN:  [id number] Principal Diagnosis: Adjustment disorder with depressed mood Diagnosis:   Patient Active Problem List   Diagnosis Date Noted  . Adjustment disorder with depressed mood [F43.21] 08/04/2017    Priority: High    Total Time spent with patient: 45 minutes  Subjective:   Theodore Bishop is a 32 y.o. male patient does not warrant admission.  HPI:  32 yo male who presented to the ED with depression and lack of sleep.  Suffering from postpartum depression after the birth of his daughter 3 months ago.  When the baby cries, he cries.  His sleep has been minimal.  No suicidal/homicidal ideations, hallucinations, or substance abuse.  He reports bonding well with his child and has no thoughts to harm his child.  Agreeable to start an antidepressant and stay over night.  Prozac started for depression and Ambien for sleep last night, changed to Trazodone at discharge.  Theodore Bishop slept last night and feels much better today.  Interested in outpatient therapy/care with a [place] speaking provider, resources provided.  Stable for dischare.  Past Psychiatric History: depression  Risk to Self: None Risk to Others: Homicidal Ideation: No Thoughts of Harm to Others: No Current Homicidal Intent: No Current Homicidal Plan: No Access to Homicidal Means: No Identified Victim: n/a History of harm to others?: No Assessment of Violence: None Noted Violent Behavior Description: none noted Does patient have access to weapons?: No Criminal Charges Pending?: No Does patient have a court date: No Prior Inpatient Therapy: Prior Inpatient Therapy: No Prior Outpatient Therapy: Prior Outpatient Therapy: No Does patient have an ACCT team?: No Does patient have Intensive In-House Services?  : No Does patient have [facility] services? : No Does patient  have P4CC services?: No  Past Medical History:  Past Medical History:  Diagnosis Date  . Depression   . Sinusitis     Past Surgical History:  Procedure Laterality Date  . HERNIA REPAIR    . NOSE SURGERY N/A 2013  . RHINOPLASTY     Family History:  Family History  Problem Relation Age of Onset  . Cancer Mother    Family Psychiatric  History: none Social History:  Social History   Substance and Sexual Activity  Alcohol Use No   Comment: quit x 1 month - previously drank 12pk every weekend     Social History   Substance and Sexual Activity  Drug Use No    Social History   Socioeconomic History  . Marital status: Married    Spouse name: Not on file  . Number of children: Not on file  . Years of education: Not on file  . Highest education level: Not on file  Occupational History  . Not on file  Social Needs  . Financial resource strain: Not on file  . Food insecurity:    Worry: Not on file    Inability: Not on file  . Transportation needs:    Medical: Not on file    Non-medical: Not on file  Tobacco Use  . Smoking status: Former Smoker    Packs/day: 0.00    Years: 9.00    Pack years: 0.00    Types: Cigarettes  . Smokeless tobacco: Never Used  . Tobacco comment: smokes 1 cigarette per week  Substance and Sexual Activity  . Alcohol use: No    Comment: quit x 1 month -  previously drank 12pk every weekend  . Drug use: No  . Sexual activity: Yes  Lifestyle  . Physical activity:    Days per week: Not on file    Minutes per session: Not on file  . Stress: Not on file  Relationships  . Social connections:    Talks on phone: Not on file    Gets together: Not on file    Attends religious service: Not on file    Active member of club or organization: Not on file    Attends meetings of clubs or organizations: Not on file    Relationship status: Not on file  Other Topics Concern  . Not on file  Social History Narrative  . Not on file   Additional Social  History: N/A    Allergies:  No Known Allergies  Labs:  Results for orders placed or performed during the hospital encounter of 08/03/17 (from the past 48 hour(s))  Rapid urine drug screen (hospital performed)     Status: None   Collection Time: 08/03/17 12:27 PM  Result Value Ref Range   Opiates NONE DETECTED NONE DETECTED   Cocaine NONE DETECTED NONE DETECTED   Benzodiazepines NONE DETECTED NONE DETECTED   Amphetamines NONE DETECTED NONE DETECTED   Tetrahydrocannabinol NONE DETECTED NONE DETECTED   Barbiturates NONE DETECTED NONE DETECTED    Comment: (NOTE) DRUG SCREEN FOR MEDICAL PURPOSES ONLY.  IF CONFIRMATION IS NEEDED FOR ANY PURPOSE, NOTIFY LAB WITHIN 5 DAYS. LOWEST DETECTABLE LIMITS FOR URINE DRUG SCREEN Drug Class                     Cutoff (ng/mL) Amphetamine and metabolites    1000 Barbiturate and metabolites    200 Benzodiazepine                 330 Tricyclics and metabolites     300 Opiates and metabolites        300 Cocaine and metabolites        300 THC                            50 Performed at [facility], [place] [address], [place], [place] [address]   Comprehensive metabolic panel     Status: Abnormal   Collection Time: 08/03/17 12:44 PM  Result Value Ref Range   Sodium 139 135 - 145 mmol/L   Potassium 3.7 3.5 - 5.1 mmol/L   Chloride 104 101 - 111 mmol/L   CO2 23 22 - 32 mmol/L   Glucose, Bld 114 (H) 65 - 99 mg/dL   BUN 9 6 - 20 mg/dL   Creatinine, Ser 0.89 0.61 - 1.24 mg/dL   Calcium 9.5 8.9 - 10.3 mg/dL   Total Protein 7.6 6.5 - 8.1 g/dL   Albumin 4.9 3.5 - 5.0 g/dL   AST 25 15 - 41 U/L   ALT 35 17 - 63 U/L   Alkaline Phosphatase 55 38 - 126 U/L   Total Bilirubin 1.1 0.3 - 1.2 mg/dL   GFR calc non Af Amer >60 >60 mL/min   GFR calc Af Amer >60 >60 mL/min    Comment: (NOTE) The eGFR has been calculated using the CKD EPI equation. This calculation has not been validated in all clinical situations. eGFR's persistently <60 mL/min  signify possible Chronic Kidney Disease.    Anion gap 12 5 - 15    Comment: Performed at [facility], [place] [address] [place], [place] [address]  Ethanol     Status: None   Collection Time: 08/03/17 12:44 PM  Result Value Ref Range   Alcohol, Ethyl (B) <10 <10 mg/dL    Comment: (NOTE) Lowest detectable limit for serum alcohol is 10 mg/dL. For medical purposes only. Performed at [facility], [place] [address], [place], [place] [address]   Salicylate level     Status: None   Collection Time: 08/03/17 12:44 PM  Result Value Ref Range   Salicylate Lvl <2.8 2.8 - 30.0 mg/dL    Comment: Performed at [facility], [place] [address] [place], [place] [address]  Acetaminophen level     Status: Abnormal   Collection Time: 08/03/17 12:44 PM  Result Value Ref Range   Acetaminophen (Tylenol), Serum <10 (L) 10 - 30 ug/mL    Comment: (NOTE) Therapeutic concentrations vary significantly. A range of 10-30 ug/mL  may be an effective concentration for many patients. However, some  are best treated at concentrations outside of this range. Acetaminophen concentrations >150 ug/mL at 4 hours after ingestion  and >50 ug/mL at 12 hours after ingestion are often associated with  toxic reactions. Performed at [facility], [place] [address] [place], [place] [address]   cbc     Status: None   Collection Time: 08/03/17 12:44 PM  Result Value Ref Range   WBC 6.0 4.0 - 10.5 K/uL   RBC 4.96 4.22 - 5.81 MIL/uL   Hemoglobin 14.0 13.0 - 17.0 g/dL   HCT 41.4 39.0 - 52.0 %   MCV 83.5 78.0 - 100.0 fL   MCH 28.2 26.0 - 34.0 pg   MCHC 33.8 30.0 - 36.0 g/dL   RDW 12.7 11.5 - 15.5 %   Platelets 247 150 - 400 K/uL    Comment: Performed at [facility], [place] [address], [place], [place] [address]    Current Facility-Administered Medications  Medication Dose Route Frequency Provider Last Rate Last Dose  . FLUoxetine  (PROZAC) capsule 20 mg  20 mg Oral Daily Patrecia Pour, NP   20 mg at 08/03/17 1601  . zolpidem (AMBIEN) tablet 5 mg  5 mg Oral QHS Patrecia Pour, NP   5 mg at 08/03/17 2116   Current Outpatient Medications  Medication Sig Dispense Refill  . gabapentin (NEURONTIN) 300 MG capsule Take 1 capsule (300 mg total) by mouth 3 (three) times daily. As needed for numbness and tingling 30 capsule 0  . omeprazole (PRILOSEC) 20 MG capsule Take 1 capsule (20 mg total) by mouth 2 (two) times daily before a meal. 60 capsule 0  . ranitidine (ZANTAC) 150 MG tablet Take 1 tablet (150 mg total) by mouth 2 (two) times daily. 60 tablet 0  . amoxicillin (AMOXIL) 500 MG capsule Take 2 capsules (1,000 mg total) by mouth 2 (two) times daily. (Patient not taking: Reported on 08/03/2017) 56 capsule 0  . clarithromycin (BIAXIN) 500 MG tablet Take 1 tablet (500 mg total) by mouth 2 (two) times daily. (Patient not taking: Reported on 08/03/2017) 28 tablet 0  . ondansetron (ZOFRAN-ODT) 8 MG disintegrating tablet Take 1 tablet (8 mg total) by mouth every 8 (eight) hours as needed for nausea or vomiting. (Patient not taking: Reported on 08/03/2017) 15 tablet 0    Musculoskeletal: Strength & Muscle Tone: within normal limits Gait & Station: normal Patient leans: N/A  Psychiatric Specialty Exam: Physical Exam  Nursing note and vitals reviewed.  Constitutional: He is oriented to person, place, and time. He appears well-developed and well-nourished.  HENT:  Head: Normocephalic and atraumatic.  Neck: Normal range of motion.  Respiratory: Effort normal.  Musculoskeletal: Normal range of motion.  Neurological: He is alert and oriented to person, place, and time.  Psychiatric: His speech is normal and behavior is normal. Judgment and thought content normal. Cognition and memory are normal. He exhibits a depressed mood.    Review of Systems  Psychiatric/Behavioral: Positive for depression.  All other systems reviewed and are  negative.   Blood pressure (!) 147/90, pulse 95, temperature 98.6 F (37 C), temperature source Oral, resp. rate 14, SpO2 99 %.There is no height or weight on file to calculate BMI.  General Appearance: Casual  Eye Contact:  Good  Speech:  Normal Rate  Volume:  Normal  Mood:  Depressed, mild  Affect:  Congruent  Thought Process:  Coherent and Descriptions of Associations: Intact  Orientation:  Full (Time, Place, and Person)  Thought Content:  WDL and Logical  Suicidal Thoughts:  No  Homicidal Thoughts:  No  Memory:  Immediate;   Good Recent;   Good Remote;   Good  Judgement:  Fair  Insight:  Good  Psychomotor Activity:  Normal  Concentration:  Concentration: Good and Attention Span: Good  Recall:  Good  Fund of Knowledge:  Good  Language:  Good  Akathisia:  No  Handed:  Right  AIMS (if indicated):   N/A  Assets:  Housing Leisure Time Physical Health Resilience Social Support  ADL's:  Intact  Cognition:  WNL  Sleep:   Good     Treatment Plan Summary: Major depressive disorder, single episode, mild -Started Prozac 20 mg daily for depression  Insomnia: -Started Trazodone 100 mg at bedtime PRN sleep  Disposition: No evidence of imminent risk to self or others at present.    Waylan Boga, NP 08/04/2017 10:09 AM   Patient seen face-to-face for psychiatric evaluation, chart reviewed and case discussed with the physician extender and developed treatment plan. Reviewed the information documented and agree with the treatment plan.  Buford Dresser, DO 08/07/17 3:08 PM

## 2017-08-04 NOTE — Discharge Instructions (Signed)
For your behavioral health needs, you are advised to follow up with an outpatient psychiatrist.  You are scheduled for an intake appointment with Adolphus Birchwood, PMHNP, at the [facility].  Your appointment is on Tuesday, August 15, 2017 at 10:45 am:       Adolphus Birchwood, [facility]      [facility]      [address], [address]      [place], [place] [address]      [phone]

## 2017-08-04 NOTE — BH Assessment (Signed)
[facility] Assessment Progress Note  Per Juanetta Beets, DO, this pt does not require psychiatric hospitalization at this time.  Pt is to be discharged from [facility] with recommendation to follow up with an outpatient psychiatrist.  This writer spoke to pt, who expresses a preference to have an intake appointment scheduled for him.  At 11:30 I called the [facility] and spoke to [place].  Intake appointment has been scheduled for Tuesday, 08/15/2017 at 10:45 am with Adolphus Birchwood, PMHNP.  This has been included in pt's discharge instructions.  Pt's nurse, Kendal Hymen, has been notified.  Doylene Canning, MA Triage Specialist [phone]

## 2017-08-04 NOTE — ED Notes (Signed)
Patient endorsing passive SI without plan. Patient denies HI/AVH. Plan of care discussed. Encouragement and support provided and safety maintain. Q 15 min safety checks remain and video monitoring.

## 2017-08-04 NOTE — ED Notes (Signed)
Pt discharged after reviewing discharge instructions and RX.  Pt verbalized understanding.  All belongings were returned to pt.

## 2017-08-05 NOTE — BHH Suicide Risk Assessment (Signed)
Suicide Risk Assessment  Discharge Assessment   [facility] Discharge Suicide Risk Assessment   Principal Problem: Adjustment disorder with depressed mood Discharge Diagnoses:  Patient Active Problem List   Diagnosis Date Noted  . Adjustment disorder with depressed mood [F43.21] 08/04/2017    Priority: High    Total Time spent with patient: 45 minutes  Musculoskeletal: Strength & Muscle Tone: within normal limits Gait & Station: normal Patient leans: N/A  Psychiatric Specialty Exam: Physical Exam  Nursing note and vitals reviewed. Constitutional: He is oriented to person, place, and time. He appears well-developed and well-nourished.  HENT:  Head: Normocephalic.  Neck: Normal range of motion.  Respiratory: Effort normal.  Musculoskeletal: Normal range of motion.  Neurological: He is alert and oriented to person, place, and time.  Psychiatric: His speech is normal and behavior is normal. Judgment and thought content normal. Cognition and memory are normal. He exhibits a depressed mood.    Review of Systems  Psychiatric/Behavioral: Positive for depression.  All other systems reviewed and are negative.   Blood pressure (!) 147/90, pulse 95, temperature 98.6 F (37 C), temperature source Oral, resp. rate 14, SpO2 99 %.There is no height or weight on file to calculate BMI.  General Appearance: Casual  Eye Contact:  Good  Speech:  Normal Rate  Volume:  Normal  Mood:  Depressed, mild  Affect:  Congruent  Thought Process:  Coherent and Descriptions of Associations: Intact  Orientation:  Full (Time, Place, and Person)  Thought Content:  WDL and Logical  Suicidal Thoughts:  No  Homicidal Thoughts:  No  Memory:  Immediate;   Good Recent;   Good Remote;   Good  Judgement:  Fair  Insight:  Good  Psychomotor Activity:  Normal  Concentration:  Concentration: Good and Attention Span: Good  Recall:  Good  Fund of Knowledge:  Good  Language:  Good  Akathisia:  No  Handed:  Right   AIMS (if indicated):     Assets:  Housing Leisure Time Physical Health Resilience Social Support  ADL's:  Intact  Cognition:  WNL  Sleep:       Mental Status Per Nursing Assessment::   On Admission:   depression and insomnia  Demographic Factors:  Male and Adolescent or young adult  Loss Factors: NA  Historical Factors: NA  Risk Reduction Factors:   Responsible for children under 75 years of age, Sense of responsibility to family, Living with another person, especially a relative and Positive social support  Continued Clinical Symptoms:  Depression, mild  Cognitive Features That Contribute To Risk:  None    Suicide Risk:  Minimal: No identifiable suicidal ideation.  Patients presenting with no risk factors but with morbid ruminations; may be classified as minimal risk based on the severity of the depressive symptoms    Plan Of Care/Follow-up recommendations:  Activity:  as tolerated Diet:  heart healthy diet  Theodore Klinke, NP 08/05/2017, 10:41 AM

## 2020-11-09 ENCOUNTER — Other Ambulatory Visit: Payer: Self-pay

## 2020-11-09 ENCOUNTER — Encounter (HOSPITAL_BASED_OUTPATIENT_CLINIC_OR_DEPARTMENT_OTHER): Payer: Self-pay

## 2020-11-09 ENCOUNTER — Emergency Department (HOSPITAL_BASED_OUTPATIENT_CLINIC_OR_DEPARTMENT_OTHER)
Admission: EM | Admit: 2020-11-09 | Discharge: 2020-11-09 | Disposition: A | Discharge status: Home / Self Care | Payer: Medicaid Other | Attending: Emergency Medicine | Admitting: Emergency Medicine

## 2020-11-09 DIAGNOSIS — R799 Abnormal finding of blood chemistry, unspecified: Secondary | ICD-10-CM | POA: Diagnosis present

## 2020-11-09 DIAGNOSIS — Z01812 Encounter for preprocedural laboratory examination: Secondary | ICD-10-CM | POA: Insufficient documentation

## 2020-11-09 DIAGNOSIS — Z0189 Encounter for other specified special examinations: Secondary | ICD-10-CM

## 2020-11-09 DIAGNOSIS — Z87891 Personal history of nicotine dependence: Secondary | ICD-10-CM | POA: Insufficient documentation

## 2020-11-09 LAB — CBC WITH DIFFERENTIAL/PLATELET
Abs Immature Granulocytes: 0.02 10*3/uL (ref 0.00–0.07)
Basophils Absolute: 0 10*3/uL (ref 0.0–0.1)
Basophils Relative: 0 %
Eosinophils Absolute: 0.1 10*3/uL (ref 0.0–0.5)
Eosinophils Relative: 2 %
HCT: 42.6 % (ref 39.0–52.0)
Hemoglobin: 14.4 g/dL (ref 13.0–17.0)
Immature Granulocytes: 0 %
Lymphocytes Relative: 42 %
Lymphs Abs: 2 10*3/uL (ref 0.7–4.0)
MCH: 28.8 pg (ref 26.0–34.0)
MCHC: 33.8 g/dL (ref 30.0–36.0)
MCV: 85.2 fL (ref 80.0–100.0)
Monocytes Absolute: 0.3 10*3/uL (ref 0.1–1.0)
Monocytes Relative: 6 %
Neutro Abs: 2.4 10*3/uL (ref 1.7–7.7)
Neutrophils Relative %: 50 %
Platelets: 236 10*3/uL (ref 150–400)
RBC: 5 MIL/uL (ref 4.22–5.81)
RDW: 12.1 % (ref 11.5–15.5)
WBC: 4.8 10*3/uL (ref 4.0–10.5)
nRBC: 0 % (ref 0.0–0.2)

## 2020-11-09 LAB — BASIC METABOLIC PANEL
Anion gap: 8 (ref 5–15)
BUN: 14 mg/dL (ref 6–20)
CO2: 25 mmol/L (ref 22–32)
Calcium: 9 mg/dL (ref 8.9–10.3)
Chloride: 103 mmol/L (ref 98–111)
Creatinine, Ser: 0.84 mg/dL (ref 0.61–1.24)
GFR, Estimated: >60 mL/min (ref >60)
Glucose, Bld: 114 mg/dL — ABNORMAL HIGH (ref 70–99)
Potassium: 3.8 mmol/L (ref 3.5–5.1)
Sodium: 136 mmol/L (ref 135–145)

## 2020-11-09 LAB — HEPATIC FUNCTION PANEL
ALT: 82 U/L — ABNORMAL HIGH (ref 0–44)
AST: 34 U/L (ref 15–41)
Albumin: 4.5 g/dL (ref 3.5–5.0)
Alkaline Phosphatase: 53 U/L (ref 38–126)
Bilirubin, Direct: 0.1 mg/dL (ref 0.0–0.2)
Indirect Bilirubin: 0.5 mg/dL (ref 0.3–0.9)
Total Bilirubin: 0.6 mg/dL (ref 0.3–1.2)
Total Protein: 7 g/dL (ref 6.5–8.1)

## 2020-11-09 NOTE — Discharge Instructions (Addendum)
Your liver enzymes have significantly improved and should normalize if you continue to stain from drinking alcohol.  I would also advise only rarely using Tylenol and using Motrin instead at this time which does not affect the liver.  I have attached a printout of your lab values today.  Return for any new or worsening symptoms.

## 2020-11-09 NOTE — ED Provider Notes (Signed)
[facility] EMERGENCY DEPARTMENT Provider Note   CSN: [id number] Arrival date & time: 11/09/20  1117     History Chief Complaint  Patient presents with   Abnormal Lab    Theodore Bishop is a 35 y.o. male.  Who presents emergency department with chief complaint of abnormal labs.  Patient states that he filed for life insurance and was told at that time that his liver enzymes were elevated and that he was denied for policy.  He states that at that time he had been drinking at least 3-4 beers daily sometimes 20 ounce beers.  He quit drinking 2 weeks ago.  He denies change in color of his urine or stool.  He occasionally uses Tylenol for pain as he sometimes gets pain in his hands he has a [employer] for living.  He denies regular use of Tylenol.  He has some mild fatigue at times.  He denies unexplained weight loss, abdominal bloating or swelling.   Abnormal Lab     Past Medical History:  Diagnosis Date   Depression    Sinusitis     Patient Active Problem List   Diagnosis Date Noted   Adjustment disorder with depressed mood 08/04/2017    Past Surgical History:  Procedure Laterality Date   HERNIA REPAIR     NOSE SURGERY N/A 2013   RHINOPLASTY         Family History  Problem Relation Age of Onset   Cancer Mother     Social History   Tobacco Use   Smoking status: Former    Packs/day: 0.00    Types: Cigarettes   Smokeless tobacco: Former   Tobacco comments:    smokes 1 cigarette per week  Vaping Use   Vaping Use: Never used  Substance Use Topics   Alcohol use: Not Currently   Drug use: No    Home Medications Prior to Admission medications   Medication Sig Start Date End Date Taking? Authorizing Provider  FLUoxetine (PROZAC) 20 MG capsule Take 1 capsule (20 mg total) by mouth daily. 08/04/17   Charm Rings, NP  gabapentin (NEURONTIN) 300 MG capsule Take 1 capsule (300 mg total) by mouth 3 (three) times daily. As needed for numbness and  tingling 05/18/17   Lezlie Lye, Meda Coffee, MD  omeprazole (PRILOSEC) 20 MG capsule Take 1 capsule (20 mg total) by mouth 2 (two) times daily before a meal. 06/08/17   Wallis Bamberg, PA-C  ranitidine (ZANTAC) 150 MG tablet Take 1 tablet (150 mg total) by mouth 2 (two) times daily. 06/06/17   Wallis Bamberg, PA-C  traZODone (DESYREL) 100 MG tablet Take 1 tablet (100 mg total) by mouth at bedtime as needed for sleep. 08/04/17   Charm Rings, NP    Allergies    Patient has no known allergies.  Review of Systems   Review of Systems Ten systems reviewed and are negative for acute change, except as noted in the HPI.   Physical Exam Updated Vital Signs BP 119/80 (BP Location: Left Arm)   Pulse (!) 52   Temp 98.9 F (37.2 C) (Oral)   Resp 20   Ht 5\' 7"  (1.702 m)   Wt 83.9 kg   SpO2 100%   BMI 28.98 kg/m   Physical Exam Vitals and nursing note reviewed.  Constitutional:      General: He is not in acute distress.    Appearance: He is well-developed. He is not diaphoretic.  HENT:  Head: Normocephalic and atraumatic.  Eyes:     General: No scleral icterus.    Conjunctiva/sclera: Conjunctivae normal.  Cardiovascular:     Rate and Rhythm: Normal rate and regular rhythm.     Heart sounds: Normal heart sounds.  Pulmonary:     Effort: Pulmonary effort is normal. No respiratory distress.     Breath sounds: Normal breath sounds.  Abdominal:     Palpations: Abdomen is soft.     Tenderness: There is no abdominal tenderness.  Musculoskeletal:     Cervical back: Normal range of motion and neck supple.  Skin:    General: Skin is warm and dry.  Neurological:     Mental Status: He is alert.  Psychiatric:        Behavior: Behavior normal.    ED Results / Procedures / Treatments   Labs (all labs ordered are listed, but only abnormal results are displayed) Labs Reviewed  HEPATIC FUNCTION PANEL - Abnormal; Notable for the following components:      Result Value   ALT 82 (*)    All other  components within normal limits  BASIC METABOLIC PANEL - Abnormal; Notable for the following components:   Glucose, Bld 114 (*)    All other components within normal limits  CBC WITH DIFFERENTIAL/PLATELET    EKG None  Radiology No results found.  Procedures Procedures   Medications Ordered in ED Medications - No data to display  ED Course  I have reviewed the triage vital signs and the nursing notes.  Pertinent labs & imaging results that were available during my care of the patient were reviewed by me and considered in my medical decision making (see chart for details).    MDM Rules/Calculators/A&P                          I ordered and reviewed labs.  Previous AST and ALT markedly elevated in the [place], now normalizing although he does have a slight elevation in his ALT.  This is rather insignificant and has improved significantly as compared to outside lab results.  Patient has no other evidence of significant liver disease.  He appears otherwise appropriate for discharge at this time with outpatient follow-up.  We will continue to encourage patient to avoid use of alcohol. Final Clinical Impression(s) / ED Diagnoses Final diagnoses:  Encounter for laboratory test    Rx / DC Orders ED Discharge Orders     None        Arthor Captain, PA-C 11/09/20 1253    Ernie Avena, MD 11/09/20 [phone]

## 2020-11-09 NOTE — ED Triage Notes (Signed)
Pt states he had a physical for life insurance ~70month ago-was notified his liver enzymes were elevated-NAD-steady gait

## 2021-06-01 ENCOUNTER — Ambulatory Visit: Payer: Medicaid Other | Admitting: Orthopaedic Surgery

## 2021-06-01 ENCOUNTER — Other Ambulatory Visit: Payer: Self-pay | Admitting: Physician Assistant

## 2021-06-01 ENCOUNTER — Encounter: Payer: Self-pay | Admitting: Orthopaedic Surgery

## 2021-06-01 ENCOUNTER — Ambulatory Visit (INDEPENDENT_AMBULATORY_CARE_PROVIDER_SITE_OTHER): Payer: Medicaid Other | Admitting: Orthopaedic Surgery

## 2021-06-01 ENCOUNTER — Other Ambulatory Visit: Payer: Self-pay

## 2021-06-01 DIAGNOSIS — S92061A Displaced intraarticular fracture of right calcaneus, initial encounter for closed fracture: Secondary | ICD-10-CM | POA: Diagnosis not present

## 2021-06-01 MED ORDER — OXYCODONE-ACETAMINOPHEN 5-325 MG PO TABS: 1.0000 | ORAL_TABLET | Freq: Four times a day (QID) | ORAL | 0 refills | Status: DC | PRN

## 2021-06-01 NOTE — Progress Notes (Signed)
? ?Office Visit Note ?  ?Patient: Theodore Bishop           ?Date of Birth: [birth year]           ?MRN: [id number] ?Visit Date: 06/01/2021 ?             ?Requested by: No referring provider defined for this encounter. ?PCP: Patient, No Pcp Per (Inactive) ? ? ?Assessment & Plan: ?Visit Diagnoses:  ?1. Displaced intraarticular fracture of right calcaneus, initial encounter for closed fracture   ? ? ?Plan: I have reviewed CT scan x-rays from outside facility which shows highly comminuted intra-articular calcaneus fracture.  Referral has been made to Dr. Lajoyce Corners for surgical consultation.  Fracture blisters were decompressed and wrapped with Xeroform and foot was placed in a cam boot.  He is to keep this elevated at all times.  Nonweightbearing. ? ?Follow-Up Instructions: No follow-ups on file.  ? ?Orders:  ?No orders of the defined types were placed in this encounter. ? ?No orders of the defined types were placed in this encounter. ? ? ? ? Procedures: ?No procedures performed ? ? ?Clinical Data: ?No additional findings. ? ? ?Subjective: ?Chief Complaint  ?Patient presents with  ? Right Ankle - Pain  ? ? ?HPI ? ?Patient is a 36 year old Hispanic gentleman here for follow-up from this weekend for injury to the right calcaneus.  He fell off a ladder and landed directly on his feet.  Denies any back pain.  He was placed in a splint and given crutches. ? ?Review of Systems  ?Constitutional: Negative.   ?All other systems reviewed and are negative. ? ? ?Objective: ?Vital Signs: There were no vitals taken for this visit. ? ?Physical Exam ?Vitals and nursing note reviewed.  ?Constitutional:   ?   Appearance: He is well-developed.  ?HENT:  ?   Head: Normocephalic and atraumatic.  ?Eyes:  ?   Pupils: Pupils are equal, round, and reactive to light.  ?Pulmonary:  ?   Effort: Pulmonary effort is normal.  ?Abdominal:  ?   Palpations: Abdomen is soft.  ?Musculoskeletal:     ?   General: Normal range of motion.  ?   Cervical back: Neck  supple.  ?Skin: ?   General: Skin is warm.  ?Neurological:  ?   Mental Status: He is alert and oriented to person, place, and time.  ?Psychiatric:     ?   Behavior: Behavior normal.     ?   Thought Content: Thought content normal.     ?   Judgment: Judgment normal.  ? ? ?Ortho Exam ? ?Examination of the right foot shows moderate swelling with multiple fracture blisters more so on the medial side of the heel.  The skin on the lateral side of the calcaneus does wrinkle slightly.  There is no neurovascular compromise. ? ?Specialty Comments:  ?No specialty comments available. ? ?Imaging: ?No results found. ? ? ?PMFS History: ?Patient Active Problem List  ? Diagnosis Date Noted  ? Displaced intraarticular fracture of right calcaneus, initial encounter for closed fracture 06/01/2021  ? Adjustment disorder with depressed mood 08/04/2017  ? ?Past Medical History:  ?Diagnosis Date  ? Depression   ? Sinusitis   ?  ?Family History  ?Problem Relation Age of Onset  ? Cancer Mother   ?  ?Past Surgical History:  ?Procedure Laterality Date  ? HERNIA REPAIR    ? NOSE SURGERY N/A 2013  ? RHINOPLASTY    ? ?Social History  ? ?Occupational History  ?  Not on file  ?Tobacco Use  ? Smoking status: Former  ?  Packs/day: 0.00  ?  Types: Cigarettes  ? Smokeless tobacco: Former  ? Tobacco comments:  ?  smokes 1 cigarette per week  ?Vaping Use  ? Vaping Use: Never used  ?Substance and Sexual Activity  ? Alcohol use: Not Currently  ? Drug use: No  ? Sexual activity: Yes  ? ? ? ? ? ? ?

## 2021-06-07 ENCOUNTER — Ambulatory Visit (INDEPENDENT_AMBULATORY_CARE_PROVIDER_SITE_OTHER): Payer: Medicaid Other | Admitting: Orthopedic Surgery

## 2021-06-07 ENCOUNTER — Encounter: Payer: Self-pay | Admitting: Orthopedic Surgery

## 2021-06-07 DIAGNOSIS — S92061A Displaced intraarticular fracture of right calcaneus, initial encounter for closed fracture: Secondary | ICD-10-CM

## 2021-06-07 NOTE — Progress Notes (Signed)
? ?Office Visit Note ?  ?Patient: Theodore Bishop           ?Date of Birth: 12-14-1985           ?MRN: [id number] ?Visit Date: 06/07/2021 ?             ?Requested by: No referring provider defined for this encounter. ?PCP: Patient, No Pcp Per (Inactive) ? ?Chief Complaint  ?Patient presents with  ? Right Heel - Pain  ? ? ? ? ?HPI: ?Patient is a 36 year old gentleman who is seen for initial evaluation for closed right calcaneus fracture with extensive comminution of the posterior facet.  Initial CT scan was obtained on March 15.  He has been nonweightbearing in a fracture boot and has multiple fracture blisters medially and laterally. ? ?Patient states he fell from a ladder and all weight landed on the right foot. ? ?Assessment & Plan: ?Visit Diagnoses:  ?1. Displaced intraarticular fracture of right calcaneus, initial encounter for closed fracture   ? ? ?Plan: We will plan for open reduction internal fixation with an extensile approach laterally.  Discussed with the patient with the multiple intra-articular fractures he has an increased risk of traumatic arthritis.  Discussed there is a potential for fusion of the subtalar joint.  Initial focus to realign the calcaneus to correct the widening varus alignment and displaced posterior facet.  Risk and benefits were discussed including infection neurovascular injury DVT need for additional surgery.  Patient states he understands wished to proceed at this time. ? ?Follow-Up Instructions: Return in about 1 week (around 06/14/2021).  ? ?Ortho Exam ? ?Patient is alert, oriented, no adenopathy, well-dressed, normal affect, normal respiratory effort. ?Examination patient has excellent hair growth on his foot and a strong dorsalis pedis pulse.  The massive fracture blisters medially and laterally have healed and are dry.  There is epithelialization beneath the blisters there is no drainage.  Review of the CT scan shows a varus widened hindfoot with intra-articular fractures  through the posterior facet.  The fracture is closed. ? ?Imaging: ?No results found. ?No images are attached to the encounter. ? ?Labs: ?No results found for: HGBA1C, ESRSEDRATE, CRP, LABURIC, REPTSTATUS, GRAMSTAIN, CULT, LABORGA ? ? ?Lab Results  ?Component Value Date  ? ALBUMIN 4.5 11/09/2020  ? ALBUMIN 4.9 08/03/2017  ? ALBUMIN 5.3 06/06/2017  ? ? ?No results found for: MG ?No results found for: VD25OH ? ?No results found for: PREALBUMIN ? ?  Latest Ref Rng & Units 11/09/2020  ? 11:59 AM 08/03/2017  ? 12:44 PM 04/23/2013  ?  1:49 PM  ?CBC EXTENDED  ?WBC 4.0 - 10.5 K/uL 4.8   6.0   5.0    ?RBC 4.22 - 5.81 MIL/uL 5.00   4.96   4.72    ?Hemoglobin 13.0 - 17.0 g/dL 14.4   14.0   13.5    ?HCT 39.0 - 52.0 % 42.6   41.4   42.2    ?Platelets 150 - 400 K/uL 236   247     ?NEUT# 1.7 - 7.7 K/uL 2.4      ?Lymph# 0.7 - 4.0 K/uL 2.0      ? ? ? ?There is no height or weight on file to calculate BMI. ? ?Orders:  ?No orders of the defined types were placed in this encounter. ? ?No orders of the defined types were placed in this encounter. ? ? ? Procedures: ?No procedures performed ? ?Clinical Data: ?No additional findings. ? ?ROS: ? ?All other systems  negative, except as noted in the HPI. ?Review of Systems ? ?Objective: ?Vital Signs: There were no vitals taken for this visit. ? ?Specialty Comments:  ?No specialty comments available. ? ?PMFS History: ?Patient Active Problem List  ? Diagnosis Date Noted  ? Displaced intraarticular fracture of right calcaneus, initial encounter for closed fracture 06/01/2021  ? Adjustment disorder with depressed mood 08/04/2017  ? ?Past Medical History:  ?Diagnosis Date  ? Depression   ? Sinusitis   ?  ?Family History  ?Problem Relation Age of Onset  ? Cancer Mother   ?  ?Past Surgical History:  ?Procedure Laterality Date  ? HERNIA REPAIR    ? NOSE SURGERY N/A 2013  ? RHINOPLASTY    ? ?Social History  ? ?Occupational History  ? Not on file  ?Tobacco Use  ? Smoking status: Former  ?  Packs/day: 0.00  ?   Types: Cigarettes  ? Smokeless tobacco: Former  ? Tobacco comments:  ?  smokes 1 cigarette per week  ?Vaping Use  ? Vaping Use: Never used  ?Substance and Sexual Activity  ? Alcohol use: Not Currently  ? Drug use: No  ? Sexual activity: Yes  ? ? ? ? ? ?

## 2021-06-08 ENCOUNTER — Encounter (HOSPITAL_COMMUNITY): Payer: Self-pay | Admitting: Orthopedic Surgery

## 2021-06-08 ENCOUNTER — Other Ambulatory Visit: Payer: Self-pay

## 2021-06-08 NOTE — Progress Notes (Signed)
Using [address], [place] ID # [id number].  Mr. Arif denies chest pain or shortness of breath. Patient denies having any s/s of Covid in his household.  Patient denies any known exposure to Covid.  ? ?Mr. Tortorella does not have a PCP.  Patient is not longer taking any medications except ASA, Ibuprofen and Percocet. ? ? ?I instructed Mr. Janne Lab to shower with antibacteria soap.  No nail polish, artificial or acrylic nails. Wear clean clothes, brush your teeth. ?Glasses, contact lens,dentures or partials may not be worn in the OR. If you need to wear them, please bring a case for glasses, do not wear contacts or bring a case, the hospital does not have contact cases, dentures or partials will have to be removed , make sure they are clean, we will provide a denture cup to put them in. You will need some one to drive you home and a responsible person over the age of 76 to stay with you for the first 24 hours after surgery.  ? ?

## 2021-06-09 ENCOUNTER — Ambulatory Visit (HOSPITAL_COMMUNITY): Payer: Medicaid Other

## 2021-06-09 ENCOUNTER — Encounter (HOSPITAL_COMMUNITY): Payer: Self-pay | Admitting: Orthopedic Surgery

## 2021-06-09 ENCOUNTER — Other Ambulatory Visit: Payer: Self-pay

## 2021-06-09 ENCOUNTER — Ambulatory Visit (HOSPITAL_COMMUNITY): Payer: Medicaid Other | Admitting: Certified Registered Nurse Anesthetist

## 2021-06-09 ENCOUNTER — Ambulatory Visit (HOSPITAL_COMMUNITY)
Admission: RE | Admit: 2021-06-09 | Discharge: 2021-06-09 | Disposition: A | Discharge status: Home / Self Care | Payer: Medicaid Other | Attending: Orthopedic Surgery | Admitting: Orthopedic Surgery

## 2021-06-09 ENCOUNTER — Ambulatory Visit (HOSPITAL_BASED_OUTPATIENT_CLINIC_OR_DEPARTMENT_OTHER): Payer: Medicaid Other | Admitting: Certified Registered Nurse Anesthetist

## 2021-06-09 ENCOUNTER — Encounter (HOSPITAL_COMMUNITY)
Admission: RE | Disposition: A | Discharge status: Home / Self Care | Payer: Self-pay | Source: Home / Self Care | Attending: Orthopedic Surgery

## 2021-06-09 DIAGNOSIS — S92001A Unspecified fracture of right calcaneus, initial encounter for closed fracture: Secondary | ICD-10-CM

## 2021-06-09 DIAGNOSIS — I10 Essential (primary) hypertension: Secondary | ICD-10-CM | POA: Insufficient documentation

## 2021-06-09 DIAGNOSIS — F32A Depression, unspecified: Secondary | ICD-10-CM | POA: Diagnosis not present

## 2021-06-09 DIAGNOSIS — W11XXXA Fall on and from ladder, initial encounter: Secondary | ICD-10-CM | POA: Diagnosis not present

## 2021-06-09 DIAGNOSIS — S92061A Displaced intraarticular fracture of right calcaneus, initial encounter for closed fracture: Secondary | ICD-10-CM | POA: Diagnosis not present

## 2021-06-09 DIAGNOSIS — Z87891 Personal history of nicotine dependence: Secondary | ICD-10-CM | POA: Diagnosis not present

## 2021-06-09 HISTORY — PX: ORIF CALCANEOUS FRACTURE: SHX5030

## 2021-06-09 HISTORY — DX: Other complications of anesthesia, initial encounter: T88.59XA

## 2021-06-09 HISTORY — DX: Headache, unspecified: R51.9

## 2021-06-09 HISTORY — DX: Essential (primary) hypertension: I10

## 2021-06-09 LAB — BASIC METABOLIC PANEL
Anion gap: 8 (ref 5–15)
BUN: 15 mg/dL (ref 6–20)
CO2: 24 mmol/L (ref 22–32)
Calcium: 9.3 mg/dL (ref 8.9–10.3)
Chloride: 106 mmol/L (ref 98–111)
Creatinine, Ser: 0.93 mg/dL (ref 0.61–1.24)
GFR, Estimated: >60 mL/min (ref >60)
Glucose, Bld: 98 mg/dL (ref 70–99)
Potassium: 4.2 mmol/L (ref 3.5–5.1)
Sodium: 138 mmol/L (ref 135–145)

## 2021-06-09 LAB — CBC
HCT: 42.3 % (ref 39.0–52.0)
Hemoglobin: 14 g/dL (ref 13.0–17.0)
MCH: 28 pg (ref 26.0–34.0)
MCHC: 33.1 g/dL (ref 30.0–36.0)
MCV: 84.6 fL (ref 80.0–100.0)
Platelets: 349 10*3/uL (ref 150–400)
RBC: 5 MIL/uL (ref 4.22–5.81)
RDW: 11.8 % (ref 11.5–15.5)
WBC: 7.4 10*3/uL (ref 4.0–10.5)
nRBC: 0 % (ref 0.0–0.2)

## 2021-06-09 SURGERY — OPEN REDUCTION INTERNAL FIXATION (ORIF) CALCANEOUS FRACTURE
Anesthesia: General | Site: Heel | Laterality: Right

## 2021-06-09 MED ORDER — DEXAMETHASONE SODIUM PHOSPHATE 10 MG/ML IJ SOLN
INTRAMUSCULAR | Status: AC
  Filled 2021-06-09: qty 1

## 2021-06-09 MED ORDER — DEXAMETHASONE SODIUM PHOSPHATE 4 MG/ML IJ SOLN
INTRAMUSCULAR | Status: DC | PRN
  Administered 2021-06-09: 5 mg via PERINEURAL

## 2021-06-09 MED ORDER — MIDAZOLAM HCL 2 MG/2ML IJ SOLN
INTRAMUSCULAR | Status: DC | PRN
Start: 2021-06-09 — End: 2021-06-09
  Administered 2021-06-09: 2 mg via INTRAVENOUS

## 2021-06-09 MED ORDER — ONDANSETRON HCL 4 MG/2ML IJ SOLN
INTRAMUSCULAR | Status: AC
  Filled 2021-06-09: qty 2

## 2021-06-09 MED ORDER — CHLORHEXIDINE GLUCONATE 0.12 % MT SOLN
15.0000 mL | Freq: Once | OROMUCOSAL | Status: AC
  Administered 2021-06-09: 15 mL via OROMUCOSAL
  Filled 2021-06-09: qty 15

## 2021-06-09 MED ORDER — PROPOFOL 10 MG/ML IV BOLUS
INTRAVENOUS | Status: AC
  Filled 2021-06-09: qty 20

## 2021-06-09 MED ORDER — ONDANSETRON HCL 4 MG/2ML IJ SOLN
4.0000 mg | Freq: Once | INTRAMUSCULAR | Status: AC
  Administered 2021-06-09: 4 mg via INTRAVENOUS

## 2021-06-09 MED ORDER — LIDOCAINE 2% (20 MG/ML) 5 ML SYRINGE
INTRAMUSCULAR | Status: AC
  Filled 2021-06-09: qty 5

## 2021-06-09 MED ORDER — PROPOFOL 10 MG/ML IV BOLUS
INTRAVENOUS | Status: DC | PRN
  Administered 2021-06-09: 200 mg via INTRAVENOUS

## 2021-06-09 MED ORDER — FENTANYL CITRATE (PF) 100 MCG/2ML IJ SOLN: 50.0000 ug | Freq: Once | INTRAMUSCULAR | Status: AC

## 2021-06-09 MED ORDER — ORAL CARE MOUTH RINSE: 15.0000 mL | Freq: Once | OROMUCOSAL | Status: AC

## 2021-06-09 MED ORDER — LACTATED RINGERS IV SOLN: INTRAVENOUS | Status: DC

## 2021-06-09 MED ORDER — CEFAZOLIN SODIUM-DEXTROSE 2-4 GM/100ML-% IV SOLN
2.0000 g | INTRAVENOUS | Status: AC
  Administered 2021-06-09: 2 g via INTRAVENOUS
  Filled 2021-06-09: qty 100

## 2021-06-09 MED ORDER — POVIDONE-IODINE 10 % EX SWAB
2.0000 "application " | Freq: Once | CUTANEOUS | Status: AC
  Administered 2021-06-09: 2 via TOPICAL

## 2021-06-09 MED ORDER — LIDOCAINE HCL (CARDIAC) PF 100 MG/5ML IV SOSY: PREFILLED_SYRINGE | INTRAVENOUS | Status: DC | PRN

## 2021-06-09 MED ORDER — EPHEDRINE SULFATE-NACL 50-0.9 MG/10ML-% IV SOSY
PREFILLED_SYRINGE | INTRAVENOUS | Status: DC | PRN
  Administered 2021-06-09: 5 mg via INTRAVENOUS
  Administered 2021-06-09 (×2): 10 mg via INTRAVENOUS
  Administered 2021-06-09 (×2): 5 mg via INTRAVENOUS

## 2021-06-09 MED ORDER — ONDANSETRON HCL 4 MG/2ML IJ SOLN
INTRAMUSCULAR | Status: DC | PRN
Start: 2021-06-09 — End: 2021-06-09
  Administered 2021-06-09: 4 mg via INTRAVENOUS

## 2021-06-09 MED ORDER — PHENYLEPHRINE 40 MCG/ML (10ML) SYRINGE FOR IV PUSH (FOR BLOOD PRESSURE SUPPORT)
PREFILLED_SYRINGE | INTRAVENOUS | Status: AC
  Filled 2021-06-09: qty 20

## 2021-06-09 MED ORDER — PHENYLEPHRINE 40 MCG/ML (10ML) SYRINGE FOR IV PUSH (FOR BLOOD PRESSURE SUPPORT)
PREFILLED_SYRINGE | INTRAVENOUS | Status: DC | PRN
  Administered 2021-06-09: 40 ug via INTRAVENOUS
  Administered 2021-06-09: 120 ug via INTRAVENOUS
  Administered 2021-06-09 (×2): 80 ug via INTRAVENOUS
  Administered 2021-06-09: 40 ug via INTRAVENOUS
  Administered 2021-06-09: 80 ug via INTRAVENOUS

## 2021-06-09 MED ORDER — ROCURONIUM BROMIDE 10 MG/ML (PF) SYRINGE
PREFILLED_SYRINGE | INTRAVENOUS | Status: AC
  Filled 2021-06-09: qty 10

## 2021-06-09 MED ORDER — CHLORHEXIDINE GLUCONATE 4 % EX LIQD: 60.0000 mL | Freq: Once | CUTANEOUS | Status: DC

## 2021-06-09 MED ORDER — CLONIDINE HCL (ANALGESIA) 100 MCG/ML EP SOLN
EPIDURAL | Status: DC | PRN
  Administered 2021-06-09: 80 ug

## 2021-06-09 MED ORDER — MIDAZOLAM HCL 2 MG/2ML IJ SOLN
INTRAMUSCULAR | Status: AC
  Filled 2021-06-09: qty 2

## 2021-06-09 MED ORDER — FENTANYL CITRATE (PF) 250 MCG/5ML IJ SOLN
INTRAMUSCULAR | Status: DC | PRN
  Administered 2021-06-09: 50 ug via INTRAVENOUS
  Administered 2021-06-09: 100 ug via INTRAVENOUS

## 2021-06-09 MED ORDER — ROCURONIUM BROMIDE 100 MG/10ML IV SOLN
INTRAVENOUS | Status: DC | PRN
  Administered 2021-06-09: 10 mg via INTRAVENOUS
  Administered 2021-06-09: 60 mg via INTRAVENOUS

## 2021-06-09 MED ORDER — DEXAMETHASONE SODIUM PHOSPHATE 10 MG/ML IJ SOLN
INTRAMUSCULAR | Status: DC | PRN
  Administered 2021-06-09: 10 mg via INTRAVENOUS

## 2021-06-09 MED ORDER — FENTANYL CITRATE (PF) 250 MCG/5ML IJ SOLN
INTRAMUSCULAR | Status: AC
  Filled 2021-06-09: qty 5

## 2021-06-09 MED ORDER — EPHEDRINE 5 MG/ML INJ
INTRAVENOUS | Status: AC
  Filled 2021-06-09: qty 10

## 2021-06-09 MED ORDER — 0.9 % SODIUM CHLORIDE (POUR BTL) OPTIME
TOPICAL | Status: DC | PRN
  Administered 2021-06-09: 1000 mL

## 2021-06-09 MED ORDER — OXYCODONE-ACETAMINOPHEN 5-325 MG PO TABS: 1.0000 | ORAL_TABLET | ORAL | 0 refills | Status: DC | PRN

## 2021-06-09 MED ORDER — SUGAMMADEX SODIUM 200 MG/2ML IV SOLN
INTRAVENOUS | Status: DC | PRN
  Administered 2021-06-09: 200 mg via INTRAVENOUS

## 2021-06-09 MED ORDER — LIDOCAINE 2% (20 MG/ML) 5 ML SYRINGE
INTRAMUSCULAR | Status: DC | PRN
  Administered 2021-06-09: 75 mg via INTRAVENOUS

## 2021-06-09 MED ORDER — MIDAZOLAM HCL 2 MG/2ML IJ SOLN: 2.0000 mg | Freq: Once | INTRAMUSCULAR | Status: AC

## 2021-06-09 MED ORDER — MIDAZOLAM HCL 2 MG/2ML IJ SOLN
INTRAMUSCULAR | Status: AC
  Administered 2021-06-09: 2 mg via INTRAVENOUS
  Filled 2021-06-09: qty 2

## 2021-06-09 MED ORDER — FENTANYL CITRATE (PF) 100 MCG/2ML IJ SOLN
INTRAMUSCULAR | Status: AC
  Administered 2021-06-09: 50 ug via INTRAVENOUS
  Filled 2021-06-09: qty 2

## 2021-06-09 MED ORDER — BUPIVACAINE HCL (PF) 0.5 % IJ SOLN
INTRAMUSCULAR | Status: DC | PRN
  Administered 2021-06-09: 30 mL via PERINEURAL

## 2021-06-09 SURGICAL SUPPLY — 62 items
BAG COUNTER SPONGE SURGICOUNT (BAG) ×2 IMPLANT
BANDAGE ESMARK 6X9 LF (GAUZE/BANDAGES/DRESSINGS) IMPLANT
BIT DRILL 2.5X2.75 QC CALB (BIT) ×1 IMPLANT
BNDG COHESIVE 1X5 TAN STRL LF (GAUZE/BANDAGES/DRESSINGS) ×1 IMPLANT
BNDG COHESIVE 4X5 TAN ST LF (GAUZE/BANDAGES/DRESSINGS) ×1 IMPLANT
BNDG COHESIVE 6X5 TAN STRL LF (GAUZE/BANDAGES/DRESSINGS) ×4 IMPLANT
BNDG ELASTIC 4X5.8 VLCR STR LF (GAUZE/BANDAGES/DRESSINGS) IMPLANT
BNDG ELASTIC 6X5.8 VLCR STR LF (GAUZE/BANDAGES/DRESSINGS) IMPLANT
BNDG ESMARK 6X9 LF (GAUZE/BANDAGES/DRESSINGS)
BNDG GAUZE ELAST 4 BULKY (GAUZE/BANDAGES/DRESSINGS) ×5 IMPLANT
COVER SURGICAL LIGHT HANDLE (MISCELLANEOUS) ×4 IMPLANT
CUFF TOURN SGL QUICK 42 (TOURNIQUET CUFF) IMPLANT
DRAPE HALF SHEET 40X57 (DRAPES) ×2 IMPLANT
DRAPE INCISE IOBAN 66X45 STRL (DRAPES) ×2 IMPLANT
DRAPE OEC MINIVIEW 54X84 (DRAPES) ×2 IMPLANT
DRAPE U-SHAPE 47X51 STRL (DRAPES) ×2 IMPLANT
DRSG ADAPTIC 3X8 NADH LF (GAUZE/BANDAGES/DRESSINGS) ×2 IMPLANT
DRSG CURAD 3X16 NADH (PACKING) ×1 IMPLANT
DURAPREP 26ML APPLICATOR (WOUND CARE) ×2 IMPLANT
ELECT REM PT RETURN 9FT ADLT (ELECTROSURGICAL) ×2
ELECTRODE REM PT RTRN 9FT ADLT (ELECTROSURGICAL) ×1 IMPLANT
GAUZE SPONGE 4X4 12PLY STRL (GAUZE/BANDAGES/DRESSINGS) ×2 IMPLANT
GLOVE SURG ORTHO LTX SZ9 (GLOVE) ×2 IMPLANT
GLOVE SURG UNDER POLY LF SZ9 (GLOVE) ×2 IMPLANT
GOWN STRL REUS W/ TWL XL LVL3 (GOWN DISPOSABLE) ×3 IMPLANT
GOWN STRL REUS W/TWL XL LVL3 (GOWN DISPOSABLE) ×3
K-WIRE ACE 1.6X6 (WIRE) ×4
K-WIRE FIXATION 2.0X6 (WIRE) ×2
KIT BASIN OR (CUSTOM PROCEDURE TRAY) ×2 IMPLANT
KIT TURNOVER KIT B (KITS) ×2 IMPLANT
KWIRE ACE 1.6X6 (WIRE) IMPLANT
KWIRE FIXATION 2.0X6 (WIRE) IMPLANT
MANIFOLD NEPTUNE II (INSTRUMENTS) ×2 IMPLANT
NS IRRIG 1000ML POUR BTL (IV SOLUTION) ×2 IMPLANT
PACK ORTHO EXTREMITY (CUSTOM PROCEDURE TRAY) ×2 IMPLANT
PAD ARMBOARD 7.5X6 YLW CONV (MISCELLANEOUS) ×4 IMPLANT
PIN SHANTZ 5MM (PIN) ×1 IMPLANT
PLATE LOCK SM RT CALC FOOT (Plate) ×1 IMPLANT
PUTTY DBM STAGRAFT PLUS 2CC (Putty) ×1 IMPLANT
SCREW CORT 3.5X26 (Screw) ×2 IMPLANT
SCREW CORT 3.5X32 (Screw) ×1 IMPLANT
SCREW CORT T15 24X3.5XST LCK (Screw) IMPLANT
SCREW CORT T15 26X3.5XST LCK (Screw) IMPLANT
SCREW CORT T15 30X3.5XST LCK (Screw) IMPLANT
SCREW CORT T15 32X3.5XST LCK (Screw) IMPLANT
SCREW CORTICAL 3.5X24MM (Screw) ×1 IMPLANT
SCREW CORTICAL 3.5X30MM (Screw) ×1 IMPLANT
SCREW LOCK CORT STAR 3.5X24 (Screw) ×1 IMPLANT
SCREW LOCK CORT STAR 3.5X26 (Screw) ×1 IMPLANT
SCREW LOCK CORT STAR 3.5X28 (Screw) ×1 IMPLANT
SCREW LOCK CORT STAR 3.5X34 (Screw) ×1 IMPLANT
SCREW LOCK CORT STAR 3.5X42 (Screw) ×1 IMPLANT
SPONGE T-LAP 18X18 ~~LOC~~+RFID (SPONGE) ×2 IMPLANT
STAPLER VISISTAT 35W (STAPLE) IMPLANT
SUCTION FRAZIER HANDLE 10FR (MISCELLANEOUS) ×1
SUCTION TUBE FRAZIER 10FR DISP (MISCELLANEOUS) ×1 IMPLANT
SUT BONE WAX W31G (SUTURE) ×1 IMPLANT
SUT ETHILON 2 0 PSLX (SUTURE) ×2 IMPLANT
SUT VIC AB 2-0 CTB1 (SUTURE) ×4 IMPLANT
TOWEL GREEN STERILE (TOWEL DISPOSABLE) ×2 IMPLANT
TOWEL GREEN STERILE FF (TOWEL DISPOSABLE) ×2 IMPLANT
TUBE CONNECTING 12X1/4 (SUCTIONS) ×2 IMPLANT

## 2021-06-09 NOTE — Anesthesia Procedure Notes (Signed)
Procedure Name: Intubation ?Date/Time: 06/09/2021 9:06 AM ?Performed by: Dorthea Cove, CRNA ?Pre-anesthesia Checklist: Patient identified, Emergency Drugs available, Suction available and Patient being monitored ?Patient Re-evaluated:Patient Re-evaluated prior to induction ?Oxygen Delivery Method: Circle system utilized ?Preoxygenation: Pre-oxygenation with 100% oxygen ?Induction Type: IV induction ?Ventilation: Mask ventilation without difficulty ?Laryngoscope Size: Mac and 4 ?Grade View: Grade II ?Tube type: Oral ?Tube size: 7.5 mm ?Number of attempts: 1 ?Airway Equipment and Method: Stylet and Oral airway ?Placement Confirmation: ETT inserted through vocal cords under direct vision, positive ETCO2 and breath sounds checked- equal and bilateral ?Secured at: 23 cm ?Tube secured with: Tape ?Dental Injury: Teeth and Oropharynx as per pre-operative assessment  ? ? ? ? ?

## 2021-06-09 NOTE — Op Note (Signed)
06/09/2021 ? ?10:42 AM ? ?PATIENT:  Theodore Bishop   ? ?PRE-OPERATIVE DIAGNOSIS:  Right Calcaneus Fracture ? ?POST-OPERATIVE DIAGNOSIS:  Same ? ?PROCEDURE:  OPEN REDUCTION INTERNAL FIXATION (ORIF) RIGHT CALCANEOUS FRACTURE ? ?SURGEON:  Nadara Mustard, MD ? ?PHYSICIAN ASSISTANT:None ?ANESTHESIA:   General ? ?PREOPERATIVE INDICATIONS:  [name] is a  36 y.o. male with a diagnosis of Right Calcaneus Fracture who failed conservative measures and elected for surgical management.   ? ?The risks benefits and alternatives were discussed with the patient preoperatively including but not limited to the risks of infection, bleeding, nerve injury, cardiopulmonary complications, the need for revision surgery, among others, and the patient was willing to proceed. ? ?OPERATIVE IMPLANTS: Biomet lateral small plate ? ?@ENCIMAGES @ ? ?OPERATIVE FINDINGS: C-arm fluoroscopy verified reduction there was comminution and crushed fragments of the posterior facet. ? ?OPERATIVE PROCEDURE: Patient brought the operating room after undergoing a regional anesthetic he then underwent a general anesthesia.  After adequate levels anesthesia were obtained patient's right lower extremity was prepped using DuraPrep draped into a sterile field a timeout was called.  Patient was in the lateral position with the marked 2 positioners.  An extensile incision was made laterally over the calcaneus.  Blunt dissection was carried down to bone and subperiosteal elevation was used to elevate the flap.  K wires were placed in the fibula and talus to hold the flap elevated.  The flap was irrigated with normal saline throughout the case.  There was significant comminution shortening and varus alignment.  The fragments were decompressed the posterior facet was elevated from the vertical position.  There was about half of the posterior facet remaining the remainder of the posterior facet was not reconstructable pieces.  These were removed.  The tuberosity was  pulled out to length and a lateral plate was applied with screws placed posteriorly this fragment was then with the Steinmann pin pulled out to length and pulled out of varus.  The plate was then secured distally with 2 screws.  With the posterior facet held elevated stay graft was packed under the fragment to maintain its elevation.  The remaining screws were placed to stabilize the fractures.  C-arm fluoroscopy verified reduction there was improvement in the length and the varus alignment of the tuberosity.  There was missing posterior facet fragments.  The wound was irrigated throughout the case remainder of the stay graft was placed the incision was closed with subcu 2-0 Vicryl and the skin closed using 2-0 nylon with an Allgower Donati suture technique to protect the flap.  Sterile dressing was applied patient was extubated taken the PACU in stable condition ? ? ?DISCHARGE PLANNING: ? ?Antibiotic duration: Preoperative antibiotics ? ?Weightbearing: Nonweightbearing on the right ? ?Pain medication: Prescription for Percocet ? ?Dressing care/ Wound VAC: Follow-up in 1 week to change the dressing ? ?Ambulatory devices: Crutches ? ?Discharge to: Home. ? ?Follow-up: In the office 1 week post operative. ? ? ? ? ? ? ?  ?

## 2021-06-09 NOTE — Anesthesia Preprocedure Evaluation (Addendum)
Anesthesia Evaluation  ?Patient identified by MRN, date of birth, ID band ?Patient awake ? ? ? ?Reviewed: ?Allergy & Precautions, NPO status , Patient's Chart, lab work & pertinent test results ? ?Airway ?Mallampati: II ? ?TM Distance: >3 FB ?Neck ROM: Full ? ? ? Dental ? ?(+) Dental Advisory Given, Teeth Intact ?  ?Pulmonary ?neg pulmonary ROS, former smoker,  ?  ?Pulmonary exam normal ?breath sounds clear to auscultation ? ? ? ? ? ? Cardiovascular ?hypertension, Normal cardiovascular exam ?Rhythm:Regular Rate:Normal ? ? ?  ?Neuro/Psych ? Headaches, PSYCHIATRIC DISORDERS Depression   ? GI/Hepatic ?negative GI ROS, Neg liver ROS,   ?Endo/Other  ?negative endocrine ROS ? Renal/GU ?negative Renal ROS  ? ?  ?Musculoskeletal ?negative musculoskeletal ROS ?(+)  ? Abdominal ?  ?Peds ? Hematology ?negative hematology ROS ?(+)   ?Anesthesia Other Findings ? ? Reproductive/Obstetrics ? ?  ? ? ? ? ? ? ? ? ? ? ? ? ? ?  ?  ? ? ? ? ? ? ? ?Anesthesia Physical ?Anesthesia Plan ? ?ASA: 2 ? ?Anesthesia Plan: General  ? ?Post-op Pain Management: Regional block*, Tylenol PO (pre-op)* and Celebrex PO (pre-op)*  ? ?Induction: Intravenous ? ?PONV Risk Score and Plan: 2 and Ondansetron, Treatment may vary due to age or medical condition, Dexamethasone and Midazolam ? ?Airway Management Planned: Oral ETT ? ?Additional Equipment:  ? ?Intra-op Plan:  ? ?Post-operative Plan: Extubation in OR ? ?Informed Consent: I have reviewed the patients History and Physical, chart, labs and discussed the procedure including the risks, benefits and alternatives for the proposed anesthesia with the patient or authorized representative who has indicated his/her understanding and acceptance.  ? ? ? ?Dental advisory given ? ?Plan Discussed with: CRNA ? ?Anesthesia Plan Comments:   ? ? ? ? ? ?Anesthesia Quick Evaluation ? ?

## 2021-06-09 NOTE — Anesthesia Procedure Notes (Signed)
Anesthesia Regional Block: Popliteal block  ? ?Pre-Anesthetic Checklist: , timeout performed,  Correct Patient, Correct Site, Correct Laterality,  Correct Procedure, Correct Position, site marked,  Risks and benefits discussed,  Surgical consent,  Pre-op evaluation,  At surgeon's request and post-op pain management ? ?Laterality: Lower and Right ? ?Prep: chloraprep     ?  ?Needles:  ?Injection technique: Single-shot ? ?Needle Type: Stimiplex   ? ? ?Needle Length: 10cm  ?Needle Gauge: 21  ? ? ? ?Additional Needles: ? ? ?Procedures:,,,, ultrasound used (permanent image in chart),,   ?Motor weakness within 5 minutes.  ?Narrative:  ?Start time: 06/09/2021 8:04 AM ?End time: 06/09/2021 8:24 AM ?Injection made incrementally with aspirations every 5 mL. ? ?Performed by: Personally  ?Anesthesiologist: Nolon Nations, MD ? ?Additional Notes: ?Nerve located and needle positioned with direct ultrasound guidance. Good perineural spread. Patient tolerated well. ? ? ? ? ?

## 2021-06-09 NOTE — Anesthesia Postprocedure Evaluation (Signed)
Anesthesia Post Note ? ?Patient: Theodore Bishop ? ?Procedure(s) Performed: OPEN REDUCTION INTERNAL FIXATION (ORIF) RIGHT CALCANEOUS FRACTURE (Right: Heel) ? ?  ? ?Patient location during evaluation: PACU ?Anesthesia Type: General ?Level of consciousness: sedated and patient cooperative ?Pain management: pain level controlled ?Vital Signs Assessment: post-procedure vital signs reviewed and stable ?Respiratory status: spontaneous breathing ?Cardiovascular status: stable ?Anesthetic complications: no ? ? ?No notable events documented. ? ?Last Vitals:  ?Vitals:  ? 06/09/21 1130 06/09/21 1142  ?BP: 130/75 119/83  ?Pulse: 62 70  ?Resp: 13 17  ?Temp:  (!) 36.4 ?C  ?SpO2: 98% 98%  ?  ?Last Pain:  ?Vitals:  ? 06/09/21 1142  ?TempSrc:   ?PainSc: 0-No pain  ? ? ?  ?  ?  ?  ?  ?  ? ?Lewie Loron ? ? ? ? ?

## 2021-06-09 NOTE — H&P (Signed)
Theodore Bishop is an 36 y.o. male.   ?Chief Complaint: Right calcaneus fracture ?HPI: Patient is a 36 year old gentleman who is seen for initial evaluation for closed right calcaneus fracture with extensive comminution of the posterior facet.  Initial CT scan was obtained on March 15.  He has been nonweightbearing in a fracture boot and has multiple fracture blisters medially and laterally.  Patient states that he fell from a ladder landing directly on the foot.  Injury occurred approximately 2 weeks ago. ? ?Past Medical History:  ?Diagnosis Date  ? Complication of anesthesia   ? dizziness for 1-2 hours  ? Depression   ? not currently on medications  ? Headache   ? has never been on medications  ? Hypertension   ? Sinusitis   ? ? ?Past Surgical History:  ?Procedure Laterality Date  ? HERNIA REPAIR    ? inguinal bilateral  ? NOSE SURGERY N/A 2013  ? RHINOPLASTY    ? ? ?Family History  ?Problem Relation Age of Onset  ? Cancer Mother   ? ?Social History:  reports that he has quit smoking. His smoking use included cigarettes. He has quit using smokeless tobacco. He reports that he does not currently use alcohol. He reports that he does not use drugs. ? ?Allergies: No Known Allergies ? ?Medications Prior to Admission  ?Medication Sig Dispense Refill  ? aspirin EC 81 MG tablet Take 81 mg by mouth daily. Swallow whole.    ? ibuprofen (ADVIL) 600 MG tablet Take 600 mg by mouth daily.    ? oxyCODONE-acetaminophen (PERCOCET) 5-325 MG tablet Take 1 tablet by mouth every 6 (six) hours as needed. 40 tablet 0  ? ? ?No results found for this or any previous visit (from the past 48 hour(s)). ?No results found. ? ?Review of Systems  ?All other systems reviewed and are negative. ? ?Height 5\' 8"  (1.727 m), weight 79.4 kg. ?Physical Exam  ?Patient is alert, oriented, no adenopathy, well-dressed, normal affect, normal respiratory effort. ?Examination patient has excellent hair growth on his foot and a strong dorsalis pedis pulse.  The  massive fracture blisters medially and laterally have healed and are dry.  There is epithelialization beneath the blisters there is no drainage.  Review of the CT scan shows a varus widened hindfoot with intra-articular fractures through the posterior facet.  The fracture is closed. ?Assessment/Plan ?1. Displaced intraarticular fracture of right calcaneus, initial encounter for closed fracture   ?  ?  ?Plan: We will plan for open reduction internal fixation with an extensile approach laterally.  Discussed with the patient with the multiple intra-articular fractures he has an increased risk of traumatic arthritis.  Discussed there is a potential for fusion of the subtalar joint.  Initial focus to realign the calcaneus to correct the widening varus alignment and displaced posterior facet.  Risk and benefits were discussed including infection neurovascular injury DVT need for additional surgery.  Patient states he understands wished to proceed at this time. ? ? , MD ?06/09/2021, 6:47 AM ? ? ? ?

## 2021-06-09 NOTE — Transfer of Care (Signed)
Immediate Anesthesia Transfer of Care Note ? ?Patient: Theodore Bishop ? ?Procedure(s) Performed: OPEN REDUCTION INTERNAL FIXATION (ORIF) RIGHT CALCANEOUS FRACTURE (Right: Heel) ? ?Patient Location: PACU ? ?Anesthesia Type:General and Regional ? ?Level of Consciousness: awake, alert  and oriented ? ?Airway & Oxygen Therapy: Patient Spontanous Breathing ? ?Post-op Assessment: Report given to RN and Post -op Vital signs reviewed and stable ? ?Post vital signs: Reviewed and stable ? ?Last Vitals:  ?Vitals Value Taken Time  ?BP 147/89 06/09/21 1046  ?Temp    ?Pulse 100 06/09/21 1047  ?Resp 20 06/09/21 1047  ?SpO2 96 % 06/09/21 1047  ?Vitals shown include unvalidated device data. ? ?Last Pain:  ?Vitals:  ? 06/09/21 0734  ?TempSrc:   ?PainSc: 4   ?   ? ?Patients Stated Pain Goal: 2 (06/09/21 0734) ? ?Complications: No notable events documented. ?

## 2021-06-11 ENCOUNTER — Encounter (HOSPITAL_COMMUNITY): Payer: Self-pay | Admitting: Orthopedic Surgery

## 2021-06-17 ENCOUNTER — Ambulatory Visit (INDEPENDENT_AMBULATORY_CARE_PROVIDER_SITE_OTHER): Payer: Medicaid Other | Admitting: Orthopedic Surgery

## 2021-06-17 DIAGNOSIS — S92061A Displaced intraarticular fracture of right calcaneus, initial encounter for closed fracture: Secondary | ICD-10-CM

## 2021-06-18 ENCOUNTER — Encounter: Payer: Self-pay | Admitting: Orthopedic Surgery

## 2021-06-18 NOTE — Progress Notes (Signed)
? ?Office Visit Note ?  ?Patient: Theodore Bishop           ?Date of Birth: Feb 21, 1986           ?MRN: [id number] ?Visit Date: 06/17/2021 ?             ?Requested by: No referring provider defined for this encounter. ?PCP: Patient, No Pcp Per (Inactive) ? ?Chief Complaint  ?Patient presents with  ? Right Heel - Routine Post Op  ?  06/09/21 ORIF right calcaneus fracture  ? ? ? ?36 year old gentleman who presents 1 week status post open reduction internal fixation right calcaneus. ?HPI: ?Patient is a ? ?Assessment & Plan: ?Visit Diagnoses:  ?1. Displaced intraarticular fracture of right calcaneus, initial encounter for closed fracture   ? ? ?Plan: Plan to follow-up in 1 week.  Discussed the importance of elevation and range of motion of the toes and ankle to decrease the swelling.   ? ?2 view radiographs of the right calcaneus at follow-up. ? ?Follow-Up Instructions: Return in about 1 week (around 06/24/2021).  ? ?Ortho Exam ? ?Patient is alert, oriented, no adenopathy, well-dressed, normal affect, normal respiratory effort. ?Examination there is swelling with the wound gaping open a little.  There is no redness no cellulitis the fracture blisters have healed. ? ?Imaging: ?No results found. ?No images are attached to the encounter. ? ?Labs: ?No results found for: HGBA1C, ESRSEDRATE, CRP, LABURIC, REPTSTATUS, GRAMSTAIN, CULT, LABORGA ? ? ?Lab Results  ?Component Value Date  ? ALBUMIN 4.5 11/09/2020  ? ALBUMIN 4.9 08/03/2017  ? ALBUMIN 5.3 06/06/2017  ? ? ?No results found for: MG ?No results found for: VD25OH ? ?No results found for: PREALBUMIN ? ?  Latest Ref Rng & Units 06/09/2021  ?  7:30 AM 11/09/2020  ? 11:59 AM 08/03/2017  ? 12:44 PM  ?CBC EXTENDED  ?WBC 4.0 - 10.5 K/uL 7.4   4.8   6.0    ?RBC 4.22 - 5.81 MIL/uL 5.00   5.00   4.96    ?Hemoglobin 13.0 - 17.0 g/dL 34.1   93.7   90.2    ?HCT 39.0 - 52.0 % 42.3   42.6   41.4    ?Platelets 150 - 400 K/uL 349   236   247    ?NEUT# 1.7 - 7.7 K/uL  2.4     ?Lymph# 0.7 - 4.0  K/uL  2.0     ? ? ? ?There is no height or weight on file to calculate BMI. ? ?Orders:  ?No orders of the defined types were placed in this encounter. ? ?No orders of the defined types were placed in this encounter. ? ? ? Procedures: ?No procedures performed ? ?Clinical Data: ?No additional findings. ? ?ROS: ? ?All other systems negative, except as noted in the HPI. ?Review of Systems ? ?Objective: ?Vital Signs: There were no vitals taken for this visit. ? ?Specialty Comments:  ?No specialty comments available. ? ?PMFS History: ?Patient Active Problem List  ? Diagnosis Date Noted  ? Displaced intraarticular fracture of right calcaneus, initial encounter for closed fracture 06/01/2021  ? Adjustment disorder with depressed mood 08/04/2017  ? ?Past Medical History:  ?Diagnosis Date  ? Complication of anesthesia   ? dizziness for 1-2 hours  ? Depression   ? not currently on medications  ? Headache   ? has never been on medications  ? Hypertension   ? Sinusitis   ?  ?Family History  ?Problem Relation Age of Onset  ?  Cancer Mother   ?  ?Past Surgical History:  ?Procedure Laterality Date  ? HERNIA REPAIR    ? inguinal bilateral  ? NOSE SURGERY N/A 2013  ? ORIF CALCANEOUS FRACTURE Right 06/09/2021  ? Procedure: OPEN REDUCTION INTERNAL FIXATION (ORIF) RIGHT CALCANEOUS FRACTURE;  Surgeon: Nadara Mustard, MD;  Location: [facility] OR;  Service: Orthopedics;  Laterality: Right;  ? RHINOPLASTY    ? ?Social History  ? ?Occupational History  ? Not on file  ?Tobacco Use  ? Smoking status: Former  ?  Packs/day: 0.00  ?  Types: Cigarettes  ? Smokeless tobacco: Former  ? Tobacco comments:  ?  smokes 1 cigarette per week  ?Vaping Use  ? Vaping Use: Never used  ?Substance and Sexual Activity  ? Alcohol use: Not Currently  ? Drug use: No  ? Sexual activity: Yes  ? ? ? ? ? ?

## 2021-06-25 ENCOUNTER — Ambulatory Visit (INDEPENDENT_AMBULATORY_CARE_PROVIDER_SITE_OTHER): Payer: Medicaid Other | Admitting: Family

## 2021-06-25 ENCOUNTER — Encounter: Payer: Self-pay | Admitting: Family

## 2021-06-25 ENCOUNTER — Ambulatory Visit (INDEPENDENT_AMBULATORY_CARE_PROVIDER_SITE_OTHER): Payer: Medicaid Other

## 2021-06-25 DIAGNOSIS — S92061A Displaced intraarticular fracture of right calcaneus, initial encounter for closed fracture: Secondary | ICD-10-CM

## 2021-06-25 MED ORDER — OXYCODONE-ACETAMINOPHEN 5-325 MG PO TABS: 1.0000 | ORAL_TABLET | ORAL | 0 refills | Status: DC | PRN

## 2021-06-25 MED ORDER — METHOCARBAMOL 500 MG PO TABS: 500.0000 mg | ORAL_TABLET | Freq: Four times a day (QID) | ORAL | 0 refills | Status: AC | PRN

## 2021-06-25 NOTE — Progress Notes (Signed)
? ?  Post-Op Visit Note ?  ?Patient: Theodore Bishop           ?Date of Birth: February 22, 1986           ?MRN: [id number] ?Visit Date: 06/25/2021 ?PCP: Patient, No Pcp Per (Inactive) ? ?Chief Complaint:  ?Chief Complaint  ?Patient presents with  ? Right Foot - Routine Post Op  ?  06/09/21 ORIF right calcaneus fracture  ? ? ?HPI:  ?HPI ?The patient is a 36 year old gentleman seen status post open reduction internal fixation of a right calcaneus fracture on March 29 he is in a fracture boot nonweightbearing with crutches.  He is interested in getting a knee walker ?Ortho Exam ?On examination of the the right foot he has mild to moderate edema no erythema his incision is well-healed this is clean and dry sutures were harvested today without incident. ? ?Visit Diagnoses:  ?1. Displaced intraarticular fracture of right calcaneus, initial encounter for closed fracture   ? ? ?Plan: Applied a medical compression stocking discussed using this around-the-clock for swelling.  May change this every other day he will continue his cam walker continue Dial soap cleansing every other day.  Nonweightbearing.  Radiographs at follow-up. ? ?Follow-Up Instructions: No follow-ups on file.  ? ?Imaging: ?No results found. ? ?Orders:  ?Orders Placed This Encounter  ?Procedures  ? XR Os Calcis Right  ? ?No orders of the defined types were placed in this encounter. ? ? ? ?PMFS History: ?Patient Active Problem List  ? Diagnosis Date Noted  ? Displaced intraarticular fracture of right calcaneus, initial encounter for closed fracture 06/01/2021  ? Adjustment disorder with depressed mood 08/04/2017  ? ?Past Medical History:  ?Diagnosis Date  ? Complication of anesthesia   ? dizziness for 1-2 hours  ? Depression   ? not currently on medications  ? Headache   ? has never been on medications  ? Hypertension   ? Sinusitis   ?  ?Family History  ?Problem Relation Age of Onset  ? Cancer Mother   ?  ?Past Surgical History:  ?Procedure Laterality Date  ? HERNIA  REPAIR    ? inguinal bilateral  ? NOSE SURGERY N/A 2013  ? ORIF CALCANEOUS FRACTURE Right 06/09/2021  ? Procedure: OPEN REDUCTION INTERNAL FIXATION (ORIF) RIGHT CALCANEOUS FRACTURE;  Surgeon: Nadara Mustard, MD;  Location: [facility] OR;  Service: Orthopedics;  Laterality: Right;  ? RHINOPLASTY    ? ?Social History  ? ?Occupational History  ? Not on file  ?Tobacco Use  ? Smoking status: Former  ?  Packs/day: 0.00  ?  Types: Cigarettes  ? Smokeless tobacco: Former  ? Tobacco comments:  ?  smokes 1 cigarette per week  ?Vaping Use  ? Vaping Use: Never used  ?Substance and Sexual Activity  ? Alcohol use: Not Currently  ? Drug use: No  ? Sexual activity: Yes  ? ? ?

## 2021-07-14 ENCOUNTER — Ambulatory Visit (INDEPENDENT_AMBULATORY_CARE_PROVIDER_SITE_OTHER): Payer: Medicaid Other

## 2021-07-14 ENCOUNTER — Encounter: Payer: Self-pay | Admitting: Family

## 2021-07-14 ENCOUNTER — Ambulatory Visit (INDEPENDENT_AMBULATORY_CARE_PROVIDER_SITE_OTHER): Payer: Medicaid Other | Admitting: Family

## 2021-07-14 DIAGNOSIS — S92061A Displaced intraarticular fracture of right calcaneus, initial encounter for closed fracture: Secondary | ICD-10-CM | POA: Diagnosis not present

## 2021-07-14 MED ORDER — OXYCODONE-ACETAMINOPHEN 5-325 MG PO TABS: 1.0000 | ORAL_TABLET | Freq: Four times a day (QID) | ORAL | 0 refills | Status: DC | PRN

## 2021-07-14 NOTE — Progress Notes (Signed)
? ?  Post-Op Visit Note ?  ?Patient: Theodore Bishop           ?Date of Birth: 08-30-1985           ?MRN: [id number] ?Visit Date: 07/14/2021 ?PCP: Patient, No Pcp Per (Inactive) ? ?Chief Complaint:  ?Chief Complaint  ?Patient presents with  ? Right Foot - Routine Post Op  ?  06/09/21 ORIF right calcaneous fracture  ? ? ?HPI:  ?HPI ?The patient is a 36 year old gentleman seen status post open reduction internal fixation of right calcaneus fracture.  He has been wearing a medical compression stocking in a cam walker he is using a knee walker he is eager to resume his weightbearing he states he has done some weightbearing in his cam walker this is pain-free ? ?Does point out to exposed internal absorbable sutures which are protruding ?Ortho Exam ?On examination his incisions are well-healed he does continue with some moderate edema there is no erythema no warmth no drainage ? ?There are 2 proximal observable sutures which are exposed these were harvested today without incident ?Visit Diagnoses:  ?1. Displaced intraarticular fracture of right calcaneus, initial encounter for closed fracture   ? ? ?Plan: He will continue with his compression and elevation for swelling continue the cam walker discussed nonweightbearing he will follow-up in the office in about 2 weeks with Dr. Lajoyce Corners.  Anticipate advancing his weightbearing at that time ? ?Follow-Up Instructions: Return in about 15 days (around 07/29/2021).  ? ?Imaging: ?No results found. ? ?Orders:  ?Orders Placed This Encounter  ?Procedures  ? XR Os Calcis Right  ? ?No orders of the defined types were placed in this encounter. ? ? ? ?PMFS History: ?Patient Active Problem List  ? Diagnosis Date Noted  ? Displaced intraarticular fracture of right calcaneus, initial encounter for closed fracture 06/01/2021  ? Adjustment disorder with depressed mood 08/04/2017  ? ?Past Medical History:  ?Diagnosis Date  ? Complication of anesthesia   ? dizziness for 1-2 hours  ? Depression   ? not  currently on medications  ? Headache   ? has never been on medications  ? Hypertension   ? Sinusitis   ?  ?Family History  ?Problem Relation Age of Onset  ? Cancer Mother   ?  ?Past Surgical History:  ?Procedure Laterality Date  ? HERNIA REPAIR    ? inguinal bilateral  ? NOSE SURGERY N/A 2013  ? ORIF CALCANEOUS FRACTURE Right 06/09/2021  ? Procedure: OPEN REDUCTION INTERNAL FIXATION (ORIF) RIGHT CALCANEOUS FRACTURE;  Surgeon: Nadara Mustard, MD;  Location: [facility] OR;  Service: Orthopedics;  Laterality: Right;  ? RHINOPLASTY    ? ?Social History  ? ?Occupational History  ? Not on file  ?Tobacco Use  ? Smoking status: Former  ?  Packs/day: 0.00  ?  Types: Cigarettes  ? Smokeless tobacco: Former  ? Tobacco comments:  ?  smokes 1 cigarette per week  ?Vaping Use  ? Vaping Use: Never used  ?Substance and Sexual Activity  ? Alcohol use: Not Currently  ? Drug use: No  ? Sexual activity: Yes  ? ? ?

## 2021-07-28 ENCOUNTER — Encounter: Payer: Medicaid Other | Admitting: Family

## 2021-07-29 ENCOUNTER — Ambulatory Visit (INDEPENDENT_AMBULATORY_CARE_PROVIDER_SITE_OTHER): Payer: Medicaid Other | Admitting: Orthopedic Surgery

## 2021-07-29 ENCOUNTER — Encounter: Payer: Self-pay | Admitting: Orthopedic Surgery

## 2021-07-29 DIAGNOSIS — S92061A Displaced intraarticular fracture of right calcaneus, initial encounter for closed fracture: Secondary | ICD-10-CM

## 2021-07-29 NOTE — Progress Notes (Signed)
Office Visit Note   Patient: Theodore Bishop           Date of Birth: Nov 09, 1985           MRN: [id number] Visit Date: 07/29/2021              Requested by: No referring provider defined for this encounter. PCP: Patient, No Pcp Per (Inactive)  Chief Complaint  Patient presents with   Right Foot - Routine Post Op    ORIF right calcaneus fx      HPI: Patient is a 36 year old gentleman who is about 6 weeks status post open reduction internal fixation right calcaneus fracture.  Patient has no complaints  Assessment & Plan: Visit Diagnoses:  1. Displaced intraarticular fracture of right calcaneus, initial encounter for closed fracture     Plan: Patient will advance to weightbearing as tolerated in a work boot or stiff soled sneaker.  Increase activities as tolerated follow-up in 4 weeks.  Follow-Up Instructions: Return in about 4 weeks (around 08/26/2021).   Ortho Exam  Patient is alert, oriented, no adenopathy, well-dressed, normal affect, normal respiratory effort. Patient denies any pain he has excellent subtalar and ankle range of motion is minimal swelling the incision is well-healed there is no cellulitis there is no crepitation with range of motion.  Imaging: No results found. No images are attached to the encounter.  Labs: No results found for: HGBA1C, ESRSEDRATE, CRP, LABURIC, REPTSTATUS, GRAMSTAIN, CULT, LABORGA   Lab Results  Component Value Date   ALBUMIN 4.5 11/09/2020   ALBUMIN 4.9 08/03/2017   ALBUMIN 5.3 06/06/2017    No results found for: MG No results found for: VD25OH  No results found for: PREALBUMIN    Latest Ref Rng & Units 06/09/2021    7:30 AM 11/09/2020   11:59 AM 08/03/2017   12:44 PM  CBC EXTENDED  WBC 4.0 - 10.5 K/uL 7.4   4.8   6.0    RBC 4.22 - 5.81 MIL/uL 5.00   5.00   4.96    Hemoglobin 13.0 - 17.0 g/dL 14.0   14.4   14.0    HCT 39.0 - 52.0 % 42.3   42.6   41.4    Platelets 150 - 400 K/uL 349   236   247    NEUT# 1.7 - 7.7 K/uL   2.4     Lymph# 0.7 - 4.0 K/uL  2.0        There is no height or weight on file to calculate BMI.  Orders:  No orders of the defined types were placed in this encounter.  No orders of the defined types were placed in this encounter.    Procedures: No procedures performed  Clinical Data: No additional findings.  ROS:  All other systems negative, except as noted in the HPI. Review of Systems  Objective: Vital Signs: There were no vitals taken for this visit.  Specialty Comments:  No specialty comments available.  PMFS History: Patient Active Problem List   Diagnosis Date Noted   Displaced intraarticular fracture of right calcaneus, initial encounter for closed fracture 06/01/2021   Adjustment disorder with depressed mood 08/04/2017   Past Medical History:  Diagnosis Date   Complication of anesthesia    dizziness for 1-2 hours   Depression    not currently on medications   Headache    has never been on medications   Hypertension    Sinusitis     Family History  Problem Relation Age  of Onset   Cancer Mother     Past Surgical History:  Procedure Laterality Date   HERNIA REPAIR     inguinal bilateral   NOSE SURGERY N/A 2013   ORIF CALCANEOUS FRACTURE Right 06/09/2021   Procedure: OPEN REDUCTION INTERNAL FIXATION (ORIF) RIGHT CALCANEOUS FRACTURE;  Surgeon: Newt Minion, MD;  Location: [place];  Service: Orthopedics;  Laterality: Right;   RHINOPLASTY     Social History   Occupational History   Not on file  Tobacco Use   Smoking status: Former    Packs/day: 0.00    Types: Cigarettes   Smokeless tobacco: Former   Tobacco comments:    smokes 1 cigarette per week  Vaping Use   Vaping Use: Never used  Substance and Sexual Activity   Alcohol use: Not Currently   Drug use: No   Sexual activity: Yes

## 2021-08-26 ENCOUNTER — Ambulatory Visit (INDEPENDENT_AMBULATORY_CARE_PROVIDER_SITE_OTHER): Payer: Medicaid Other

## 2021-08-26 ENCOUNTER — Ambulatory Visit (INDEPENDENT_AMBULATORY_CARE_PROVIDER_SITE_OTHER): Payer: Medicaid Other | Admitting: Orthopedic Surgery

## 2021-08-26 ENCOUNTER — Encounter: Payer: Self-pay | Admitting: Orthopedic Surgery

## 2021-08-26 DIAGNOSIS — S92061A Displaced intraarticular fracture of right calcaneus, initial encounter for closed fracture: Secondary | ICD-10-CM

## 2021-08-26 NOTE — Progress Notes (Signed)
Office Visit Note   Patient: Theodore Bishop           Date of Birth: June 28, 1985           MRN: [id number] Visit Date: 08/26/2021              Requested by: No referring provider defined for this encounter. PCP: Patient, No Pcp Per  Chief Complaint  Patient presents with   Right Heel - Follow-up      HPI: Patient is a 36 year old gentleman who is 3 months status post open reduction internal fixation displaced calcaneal fracture patient is weightbearing as tolerated in a fracture boot and using a cane.  Assessment & Plan: Visit Diagnoses:  1. Displaced intraarticular fracture of right calcaneus, initial encounter for closed fracture     Plan: Patient was given instructions to work on subtalar range of motion.  He will advance to regular shoewear.  Follow-Up Instructions: No follow-ups on file.   Ortho Exam  Patient is alert, oriented, no adenopathy, well-dressed, normal affect, normal respiratory effort. Examination patient has good pulses he has good ankle plantarflexion and dorsiflexion he has 20 degrees of inversion and eversion which is not painful.  Imaging: XR Os Calcis Right  Result Date: 08/26/2021 2 view radiographs of the right calcaneus shows stable internal fixation there has been loss of reduction of the posterior facet however this is stable.  No images are attached to the encounter.  Labs: No results found for: "HGBA1C", "ESRSEDRATE", "CRP", "LABURIC", "REPTSTATUS", "GRAMSTAIN", "CULT", "LABORGA"   Lab Results  Component Value Date   ALBUMIN 4.5 11/09/2020   ALBUMIN 4.9 08/03/2017   ALBUMIN 5.3 06/06/2017    No results found for: "MG" No results found for: "VD25OH"  No results found for: "PREALBUMIN"    Latest Ref Rng & Units 06/09/2021    7:30 AM 11/09/2020   11:59 AM 08/03/2017   12:44 PM  CBC EXTENDED  WBC 4.0 - 10.5 K/uL 7.4  4.8  6.0   RBC 4.22 - 5.81 MIL/uL 5.00  5.00  4.96   Hemoglobin 13.0 - 17.0 g/dL 38.1  01.7  51.0   HCT 39.0 -  52.0 % 42.3  42.6  41.4   Platelets 150 - 400 K/uL 349  236  247   NEUT# 1.7 - 7.7 K/uL  2.4    Lymph# 0.7 - 4.0 K/uL  2.0       There is no height or weight on file to calculate BMI.  Orders:  Orders Placed This Encounter  Procedures   XR Os Calcis Right   No orders of the defined types were placed in this encounter.    Procedures: No procedures performed  Clinical Data: No additional findings.  ROS:  All other systems negative, except as noted in the HPI. Review of Systems  Objective: Vital Signs: There were no vitals taken for this visit.  Specialty Comments:  No specialty comments available.  PMFS History: Patient Active Problem List   Diagnosis Date Noted   Displaced intraarticular fracture of right calcaneus, initial encounter for closed fracture 06/01/2021   Adjustment disorder with depressed mood 08/04/2017   Past Medical History:  Diagnosis Date   Complication of anesthesia    dizziness for 1-2 hours   Depression    not currently on medications   Headache    has never been on medications   Hypertension    Sinusitis     Family History  Problem Relation Age of Onset  Cancer Mother     Past Surgical History:  Procedure Laterality Date   HERNIA REPAIR     inguinal bilateral   NOSE SURGERY N/A 2013   ORIF CALCANEOUS FRACTURE Right 06/09/2021   Procedure: OPEN REDUCTION INTERNAL FIXATION (ORIF) RIGHT CALCANEOUS FRACTURE;  Surgeon: Nadara Mustard, MD;  Location: [facility] OR;  Service: Orthopedics;  Laterality: Right;   RHINOPLASTY     Social History   Occupational History   Not on file  Tobacco Use   Smoking status: Former    Packs/day: 0.00    Types: Cigarettes   Smokeless tobacco: Former   Tobacco comments:    smokes 1 cigarette per week  Vaping Use   Vaping Use: Never used  Substance and Sexual Activity   Alcohol use: Not Currently   Drug use: No   Sexual activity: Yes

## 2021-09-18 ENCOUNTER — Encounter: Payer: Self-pay | Admitting: Orthopedic Surgery

## 2021-09-22 ENCOUNTER — Encounter: Payer: Medicaid Other | Admitting: Family

## 2021-09-24 ENCOUNTER — Encounter: Payer: Self-pay | Admitting: Family

## 2021-09-24 ENCOUNTER — Ambulatory Visit (INDEPENDENT_AMBULATORY_CARE_PROVIDER_SITE_OTHER): Payer: Medicaid Other | Admitting: Family

## 2021-09-24 DIAGNOSIS — S92061A Displaced intraarticular fracture of right calcaneus, initial encounter for closed fracture: Secondary | ICD-10-CM

## 2021-09-24 MED ORDER — OXYCODONE-ACETAMINOPHEN 5-325 MG PO TABS: 1.0000 | ORAL_TABLET | Freq: Three times a day (TID) | ORAL | 0 refills | Status: AC | PRN

## 2021-09-24 NOTE — Progress Notes (Signed)
Office Visit Note   Patient: Theodore Bishop           Date of Birth: 27-Apr-1985           MRN: [id number] Visit Date: 09/24/2021              Requested by: No referring provider defined for this encounter. PCP: Patient, No Pcp Per  Chief Complaint  Patient presents with   Right Foot - Follow-up    06/09/21 ORIF right cal Fx      HPI: Patient is a 36 year old gentleman who is 4 months status post open reduction internal fixation displaced calcaneal fracture. patient is weightbearing as tolerated in regular shoewear.  He feels he is doing quite well however he does continue to have aching pain he states if he does prolonged weightbearing greater than 3 hours he has aching pain and some swelling in the right foot and ankle he feels better if he wears his cam walker or uses crutches to offload the right lower extremity  He had worked on his subtalar range of motion walking on uneven terrain as instructed at last visit.  States this is pain-free.  Assessment & Plan: Visit Diagnoses:  No diagnosis found.   Plan: Encouraged to continue working on range of motion continue increasing his activities as he tolerates with regular shoewear discussed typical course follow-up in the office on an as-needed basis  Follow-Up Instructions: No follow-ups on file.   Ortho Exam  Patient is alert, oriented, no adenopathy, well-dressed, normal affect, normal respiratory effort. Examination patient has good pulses he has good ankle plantarflexion and dorsiflexion he has 20 degrees of inversion and eversion which is not painful.  Imaging: No results found. No images are attached to the encounter.  Labs: No results found for: "HGBA1C", "ESRSEDRATE", "CRP", "LABURIC", "REPTSTATUS", "GRAMSTAIN", "CULT", "LABORGA"   Lab Results  Component Value Date   ALBUMIN 4.5 11/09/2020   ALBUMIN 4.9 08/03/2017   ALBUMIN 5.3 06/06/2017    No results found for: "MG" No results found for: "VD25OH"  No  results found for: "PREALBUMIN"    Latest Ref Rng & Units 06/09/2021    7:30 AM 11/09/2020   11:59 AM 08/03/2017   12:44 PM  CBC EXTENDED  WBC 4.0 - 10.5 K/uL 7.4  4.8  6.0   RBC 4.22 - 5.81 MIL/uL 5.00  5.00  4.96   Hemoglobin 13.0 - 17.0 g/dL 94.1  74.0  81.4   HCT 39.0 - 52.0 % 42.3  42.6  41.4   Platelets 150 - 400 K/uL 349  236  247   NEUT# 1.7 - 7.7 K/uL  2.4    Lymph# 0.7 - 4.0 K/uL  2.0       There is no height or weight on file to calculate BMI.  Orders:  No orders of the defined types were placed in this encounter.  No orders of the defined types were placed in this encounter.    Procedures: No procedures performed  Clinical Data: No additional findings.  ROS:  All other systems negative, except as noted in the HPI. Review of Systems  Objective: Vital Signs: There were no vitals taken for this visit.  Specialty Comments:  No specialty comments available.  PMFS History: Patient Active Problem List   Diagnosis Date Noted   Displaced intraarticular fracture of right calcaneus, initial encounter for closed fracture 06/01/2021   Adjustment disorder with depressed mood 08/04/2017   Past Medical History:  Diagnosis Date  Complication of anesthesia    dizziness for 1-2 hours   Depression    not currently on medications   Headache    has never been on medications   Hypertension    Sinusitis     Family History  Problem Relation Age of Onset   Cancer Mother     Past Surgical History:  Procedure Laterality Date   HERNIA REPAIR     inguinal bilateral   NOSE SURGERY N/A 2013   ORIF CALCANEOUS FRACTURE Right 06/09/2021   Procedure: OPEN REDUCTION INTERNAL FIXATION (ORIF) RIGHT CALCANEOUS FRACTURE;  Surgeon: Nadara Mustard, MD;  Location: [facility] OR;  Service: Orthopedics;  Laterality: Right;   RHINOPLASTY     Social History   Occupational History   Not on file  Tobacco Use   Smoking status: Former    Packs/day: 0.00    Types: Cigarettes   Smokeless  tobacco: Former   Tobacco comments:    smokes 1 cigarette per week  Vaping Use   Vaping Use: Never used  Substance and Sexual Activity   Alcohol use: Not Currently   Drug use: No   Sexual activity: Yes

## 2023-04-18 ENCOUNTER — Ambulatory Visit: Payer: Medicaid Other | Admitting: Orthopedic Surgery

## 2024-01-15 ENCOUNTER — Encounter: Payer: Self-pay | Admitting: Radiology
